# Patient Record
Sex: Male | Born: 1980 | Race: White | Hispanic: No | Marital: Single | Smoking: Former smoker
Health system: Southern US, Community
[De-identification: ages and names within clinical notes are randomized; demographics above are authoritative.]

---

## 2002-12-07 ENCOUNTER — Emergency Department (HOSPITAL_COMMUNITY): Admission: EM | Admit: 2002-12-07 | Discharge: 2002-12-07 | Payer: Self-pay | Admitting: Emergency Medicine

## 2002-12-07 ENCOUNTER — Encounter: Payer: Self-pay | Admitting: Emergency Medicine

## 2004-10-31 ENCOUNTER — Inpatient Hospital Stay (HOSPITAL_COMMUNITY): Admission: AC | Admit: 2004-10-31 | Discharge: 2004-12-05 | Payer: Self-pay

## 2004-12-22 ENCOUNTER — Ambulatory Visit (HOSPITAL_COMMUNITY): Admission: RE | Admit: 2004-12-22 | Discharge: 2004-12-22 | Payer: Self-pay | Admitting: Neurosurgery

## 2005-02-06 ENCOUNTER — Ambulatory Visit (HOSPITAL_COMMUNITY): Admission: RE | Admit: 2005-02-06 | Discharge: 2005-02-06 | Payer: Self-pay | Admitting: Neurosurgery

## 2005-04-03 ENCOUNTER — Ambulatory Visit (HOSPITAL_COMMUNITY): Admission: RE | Admit: 2005-04-03 | Discharge: 2005-04-03 | Payer: Self-pay | Admitting: Neurosurgery

## 2009-06-28 HISTORY — PX: OTHER SURGICAL HISTORY: SHX169

## 2017-07-14 ENCOUNTER — Emergency Department: Payer: Self-pay

## 2017-07-14 ENCOUNTER — Emergency Department
Admission: EM | Admit: 2017-07-14 | Discharge: 2017-07-14 | Disposition: A | Discharge status: Home / Self Care | Payer: Self-pay | Attending: Emergency Medicine | Admitting: Emergency Medicine

## 2017-07-14 DIAGNOSIS — Y929 Unspecified place or not applicable: Secondary | ICD-10-CM | POA: Insufficient documentation

## 2017-07-14 DIAGNOSIS — Y990 Civilian activity done for income or pay: Secondary | ICD-10-CM | POA: Insufficient documentation

## 2017-07-14 DIAGNOSIS — S62335A Displaced fracture of neck of fourth metacarpal bone, left hand, initial encounter for closed fracture: Secondary | ICD-10-CM | POA: Insufficient documentation

## 2017-07-14 DIAGNOSIS — W2209XA Striking against other stationary object, initial encounter: Secondary | ICD-10-CM | POA: Insufficient documentation

## 2017-07-14 DIAGNOSIS — S62337A Displaced fracture of neck of fifth metacarpal bone, left hand, initial encounter for closed fracture: Secondary | ICD-10-CM | POA: Insufficient documentation

## 2017-07-14 DIAGNOSIS — Y939 Activity, unspecified: Secondary | ICD-10-CM | POA: Insufficient documentation

## 2017-07-14 MED ORDER — OXYCODONE HCL 5 MG PO TABS: 5.0000 mg | ORAL_TABLET | Freq: Three times a day (TID) | ORAL | 0 refills | Status: AC | PRN

## 2017-07-14 MED ORDER — OXYCODONE-ACETAMINOPHEN 5-325 MG PO TABS
1.0000 | ORAL_TABLET | Freq: Once | ORAL | Status: AC
Start: 2017-07-14 — End: 2017-07-14
  Administered 2017-07-14: 1 via ORAL
  Filled 2017-07-14: qty 1

## 2017-07-14 MED ORDER — IBUPROFEN 800 MG PO TABS: 800.0000 mg | ORAL_TABLET | Freq: Three times a day (TID) | ORAL | 0 refills | Status: DC | PRN

## 2017-07-14 NOTE — ED Notes (Signed)
Reviewed d/c instructions, prescriptions, follow-up care, splint care, use of ice/elevation with patient. Pt verbalized understanding.

## 2017-07-14 NOTE — Discharge Instructions (Signed)
Please rest ice and elevate the left upper extremity. I would recommend avoiding repetitive lifting and pushing pulling with the left hand to allow the swelling decrease. Call orthopedics in 1 day to schedule follow-up appointment.

## 2017-07-14 NOTE — ED Provider Notes (Signed)
[facility]-EMERGENCY DEPARTMENT Provider Note   CSN: [id number] Arrival date & time: 07/14/17  2154     History   Chief Complaint Chief Complaint  Patient presents with  . Hand Injury    HPI Kevin Gilbert is a 36 y.o. male who presents to the emergency department for evaluation of left hand pain and swelling. Patient states yesterday morning he punched a truck, developed pain and swelling throughout the left hand. He continued to work, developed swelling throughout the hand. His pain is currently moderate. He has tried ibuprofen and Aleve with mild improvement. He denies any numbness or tingling throughout the left hand. He is right-hand dominant. Denies any other injury to his body.  HPI  No past medical history on file.  There are no active problems to display for this patient.   No past surgical history on file.     Home Medications    Prior to Admission medications   Medication Sig Start Date End Date Taking? Authorizing Provider  ibuprofen (ADVIL,MOTRIN) 800 MG tablet Take 1 tablet (800 mg total) by mouth every 8 (eight) hours as needed. 07/14/17   Evon Slack, PA-C  oxyCODONE (ROXICODONE) 5 MG immediate release tablet Take 1 tablet (5 mg total) by mouth every 8 (eight) hours as needed. 07/14/17 07/14/18  Evon Slack, PA-C    Family History No family history on file.  Social History Social History  Substance Use Topics  . Smoking status: Not on file  . Smokeless tobacco: Not on file  . Alcohol use Not on file     Allergies   Patient has no known allergies.   Review of Systems Review of Systems  Constitutional: Negative for fever.  Musculoskeletal: Positive for arthralgias and joint swelling. Negative for neck pain and neck stiffness.  Skin: Negative for rash and wound.  Neurological: Negative for numbness.     Physical Exam Updated Vital Signs BP (!) 138/104   Pulse 100   Temp 98.3 F (36.8 C) (Oral)   Resp 16   Ht 5\' 11"  (1.803 m)    Wt 95.3 kg (210 lb)   SpO2 96%   BMI 29.29 kg/m   Physical Exam  Constitutional: He appears well-developed and well-nourished.  HENT:  Head: Normocephalic and atraumatic.  Eyes: Conjunctivae are normal.  Neck: Normal range of motion.  Cardiovascular: Normal rate.   Pulmonary/Chest: Effort normal. No respiratory distress.  Musculoskeletal:  Examination of the left hand just patient has moderate swelling throughout the carpals, third fourth and fifth digits of the right hand. He has good range of motion of the thumb and index finger. There is no warmth or redness. No open wounds. He is tender along the fourth and fifth MCP joints.  Lymphadenopathy:    He has no cervical adenopathy.  Neurological: He is alert.  Skin: Skin is warm. No rash noted. No erythema.     ED Treatments / Results  Labs (all labs ordered are listed, but only abnormal results are displayed) Labs Reviewed - No data to display  EKG  EKG Interpretation None       Radiology Dg Hand Complete Left  Result Date: 07/14/2017 CLINICAL DATA:  Dorsal pain and swelling after punching a truck EXAM: LEFT HAND - COMPLETE 3+ VIEW COMPARISON:  None. FINDINGS: Examination is limited by positioning and flexion of the digits. Diffuse soft tissue swelling over the dorsum of the hand. No subluxation. Acute nondisplaced fractures involving the necks of the fourth and fifth metacarpals with  mild volar angulation of the distal fracture fragments. No radiopaque foreign body. Possible old deformity of the second proximal phalanx IMPRESSION: Acute slightly angulated fractures involving the distal fourth and fifth metacarpals. Electronically Signed   By: Jasmine Pang M.D.   On: 07/14/2017 22:22    Procedures Procedures (including critical care time) SPLINT APPLICATION Date/Time: 11:22 PM Authorized by: Patience Musca Consent: Verbal consent obtained. Risks and benefits: risks, benefits and alternatives were  discussed Consent given by: patient Splint applied by: ED physician assistant Location details: left hand Splint type: on a gutter Supplies used: cast padding, Ortho-Glass, Ace wrap Post-procedure: The splinted body part was neurovascularly unchanged following the procedure. Patient tolerance: Patient tolerated the procedure well with no immediate complications.     Medications Ordered in ED Medications  oxyCODONE-acetaminophen (PERCOCET/ROXICET) 5-325 MG per tablet 1 tablet (1 tablet Oral Given 07/14/17 2238)     Initial Impression / Assessment and Plan / ED Course  I have reviewed the triage vital signs and the nursing notes.  Pertinent labs & imaging results that were available during my care of the patient were reviewed by me and considered in my medical decision making (see chart for details).     36 year old male with fourth and fifth metacarpal neck fractures, minimal displacement. Patient is placed into it on a gutter splint. He will take ibuprofen as needed for pain. Prescription for oxycodone is given for severe pain as needed. He will call orthopedics to schedule follow-up appointment. Return to the ER for any increasing pain, swelling, numbness/ tingling or for any worsening symptoms or changes in his health.  Final Clinical Impressions(s) / ED Diagnoses   Final diagnoses:  Closed displaced fracture of neck of fourth metacarpal bone of left hand, initial encounter  Closed displaced fracture of neck of fifth metacarpal bone of left hand, initial encounter    New Prescriptions New Prescriptions   IBUPROFEN (ADVIL,MOTRIN) 800 MG TABLET    Take 1 tablet (800 mg total) by mouth every 8 (eight) hours as needed.   OXYCODONE (ROXICODONE) 5 MG IMMEDIATE RELEASE TABLET    Take 1 tablet (5 mg total) by mouth every 8 (eight) hours as needed.     Evon Slack, PA-C 07/14/17 2324    Phineas Semen, MD 07/14/17 [phone]

## 2017-07-14 NOTE — ED Triage Notes (Signed)
Patient states that he punched a truck with his left hand. Patient with pain and swelling to left hand.

## 2017-07-14 NOTE — ED Triage Notes (Signed)
Pt states he punched a truck last pm resulting in left hand injury. Sensory and cap refill intact to finger, however pt is not able to move fingers.

## 2021-07-17 ENCOUNTER — Emergency Department: Payer: 59

## 2021-07-17 ENCOUNTER — Inpatient Hospital Stay: Payer: 59

## 2021-07-17 ENCOUNTER — Encounter: Payer: Self-pay | Admitting: Family Medicine

## 2021-07-17 ENCOUNTER — Inpatient Hospital Stay
Admission: EM | Admit: 2021-07-17 | Discharge: 2021-07-18 | DRG: 101 | Disposition: A | Discharge status: Home / Self Care | Payer: 59 | Attending: Internal Medicine | Admitting: Internal Medicine

## 2021-07-17 ENCOUNTER — Other Ambulatory Visit: Payer: Self-pay

## 2021-07-17 DIAGNOSIS — F10239 Alcohol dependence with withdrawal, unspecified: Secondary | ICD-10-CM | POA: Diagnosis present

## 2021-07-17 DIAGNOSIS — F10129 Alcohol abuse with intoxication, unspecified: Secondary | ICD-10-CM

## 2021-07-17 DIAGNOSIS — F10230 Alcohol dependence with withdrawal, uncomplicated: Secondary | ICD-10-CM | POA: Diagnosis not present

## 2021-07-17 DIAGNOSIS — Y900 Blood alcohol level of less than 20 mg/100 ml: Secondary | ICD-10-CM | POA: Diagnosis present

## 2021-07-17 DIAGNOSIS — Z87891 Personal history of nicotine dependence: Secondary | ICD-10-CM | POA: Diagnosis not present

## 2021-07-17 DIAGNOSIS — R569 Unspecified convulsions: Secondary | ICD-10-CM | POA: Diagnosis present

## 2021-07-17 DIAGNOSIS — Z20822 Contact with and (suspected) exposure to covid-19: Secondary | ICD-10-CM | POA: Diagnosis present

## 2021-07-17 DIAGNOSIS — F10939 Alcohol use, unspecified with withdrawal, unspecified: Secondary | ICD-10-CM

## 2021-07-17 DIAGNOSIS — R030 Elevated blood-pressure reading, without diagnosis of hypertension: Secondary | ICD-10-CM | POA: Diagnosis present

## 2021-07-17 DIAGNOSIS — Z8249 Family history of ischemic heart disease and other diseases of the circulatory system: Secondary | ICD-10-CM

## 2021-07-17 DIAGNOSIS — Z820 Family history of epilepsy and other diseases of the nervous system: Secondary | ICD-10-CM

## 2021-07-17 DIAGNOSIS — Z833 Family history of diabetes mellitus: Secondary | ICD-10-CM

## 2021-07-17 LAB — BASIC METABOLIC PANEL WITH GFR
Anion gap: 10 (ref 5–15)
BUN: 16 mg/dL (ref 6–20)
CO2: 23 mmol/L (ref 22–32)
Calcium: 8.6 mg/dL — ABNORMAL LOW (ref 8.9–10.3)
Chloride: 102 mmol/L (ref 98–111)
Creatinine, Ser: 0.77 mg/dL (ref 0.61–1.24)
GFR, Estimated: >60 mL/min
Glucose, Bld: 80 mg/dL (ref 70–99)
Potassium: 3.8 mmol/L (ref 3.5–5.1)
Sodium: 135 mmol/L (ref 135–145)

## 2021-07-17 LAB — COMPREHENSIVE METABOLIC PANEL
ALT: 33 U/L (ref 0–44)
AST: 53 U/L — ABNORMAL HIGH (ref 15–41)
Albumin: 4.8 g/dL (ref 3.5–5.0)
Alkaline Phosphatase: 55 U/L (ref 38–126)
Anion gap: 24 — ABNORMAL HIGH (ref 5–15)
BUN: 17 mg/dL (ref 6–20)
CO2: 15 mmol/L — ABNORMAL LOW (ref 22–32)
Calcium: 9.4 mg/dL (ref 8.9–10.3)
Chloride: 96 mmol/L — ABNORMAL LOW (ref 98–111)
Creatinine, Ser: 0.88 mg/dL (ref 0.61–1.24)
GFR, Estimated: >60 mL/min (ref >60)
Glucose, Bld: 102 mg/dL — ABNORMAL HIGH (ref 70–99)
Potassium: 3.6 mmol/L (ref 3.5–5.1)
Sodium: 135 mmol/L (ref 135–145)
Total Bilirubin: 1.3 mg/dL — ABNORMAL HIGH (ref 0.3–1.2)
Total Protein: 7.9 g/dL (ref 6.5–8.1)

## 2021-07-17 LAB — CBC
HCT: 41.5 % (ref 39.0–52.0)
HCT: 46.8 % (ref 39.0–52.0)
Hemoglobin: 14.8 g/dL (ref 13.0–17.0)
Hemoglobin: 16.1 g/dL (ref 13.0–17.0)
MCH: 32.4 pg (ref 26.0–34.0)
MCH: 33.6 pg (ref 26.0–34.0)
MCHC: 34.4 g/dL (ref 30.0–36.0)
MCHC: 35.7 g/dL (ref 30.0–36.0)
MCV: 94.2 fL (ref 80.0–100.0)
MCV: 94.3 fL (ref 80.0–100.0)
Platelets: 214 K/uL (ref 150–400)
Platelets: 269 10*3/uL (ref 150–400)
RBC: 4.4 MIL/uL (ref 4.22–5.81)
RBC: 4.97 MIL/uL (ref 4.22–5.81)
RDW: 12.8 % (ref 11.5–15.5)
RDW: 13.1 % (ref 11.5–15.5)
WBC: 6.7 10*3/uL (ref 4.0–10.5)
WBC: 8.4 K/uL (ref 4.0–10.5)
nRBC: 0 % (ref 0.0–0.2)
nRBC: 0 % (ref 0.0–0.2)

## 2021-07-17 LAB — URINE DRUG SCREEN, QUALITATIVE (ARMC ONLY)
Amphetamines, Ur Screen: NOT DETECTED
Barbiturates, Ur Screen: NOT DETECTED
Benzodiazepine, Ur Scrn: NOT DETECTED
Cannabinoid 50 Ng, Ur ~~LOC~~: NOT DETECTED
Cocaine Metabolite,Ur ~~LOC~~: NOT DETECTED
MDMA (Ecstasy)Ur Screen: NOT DETECTED
Methadone Scn, Ur: NOT DETECTED
Opiate, Ur Screen: NOT DETECTED
Phencyclidine (PCP) Ur S: NOT DETECTED
Tricyclic, Ur Screen: NOT DETECTED

## 2021-07-17 LAB — RESP PANEL BY RT-PCR (FLU A&B, COVID) ARPGX2
Influenza A by PCR: NEGATIVE
Influenza B by PCR: NEGATIVE
SARS Coronavirus 2 by RT PCR: NEGATIVE

## 2021-07-17 LAB — LIPASE, BLOOD: Lipase: 30 U/L (ref 11–51)

## 2021-07-17 LAB — ETHANOL: Alcohol, Ethyl (B): <10 mg/dL (ref <10)

## 2021-07-17 LAB — MAGNESIUM: Magnesium: 2.3 mg/dL (ref 1.7–2.4)

## 2021-07-17 LAB — HIV ANTIBODY (ROUTINE TESTING W REFLEX): HIV Screen 4th Generation wRfx: NONREACTIVE

## 2021-07-17 MED ORDER — MAGNESIUM HYDROXIDE 400 MG/5ML PO SUSP
30.0000 mL | Freq: Every day | ORAL | Status: DC | PRN
  Administered 2021-07-17: 30 mL via ORAL
  Filled 2021-07-17: qty 30

## 2021-07-17 MED ORDER — THIAMINE HCL 100 MG PO TABS
100.0000 mg | ORAL_TABLET | Freq: Every day | ORAL | Status: DC
  Administered 2021-07-17 – 2021-07-18 (×2): 100 mg via ORAL
  Filled 2021-07-17 (×2): qty 1

## 2021-07-17 MED ORDER — LORAZEPAM 2 MG/ML IJ SOLN
2.0000 mg | Freq: Once | INTRAMUSCULAR | Status: AC
  Administered 2021-07-17: 2 mg via INTRAVENOUS
  Filled 2021-07-17: qty 1

## 2021-07-17 MED ORDER — ACETAMINOPHEN 325 MG PO TABS
650.0000 mg | ORAL_TABLET | Freq: Four times a day (QID) | ORAL | Status: DC | PRN
  Administered 2021-07-17 – 2021-07-18 (×3): 650 mg via ORAL
  Filled 2021-07-17 (×3): qty 2

## 2021-07-17 MED ORDER — LORAZEPAM 2 MG/ML IJ SOLN
0.0000 mg | Freq: Four times a day (QID) | INTRAMUSCULAR | Status: DC
Start: 2021-07-17 — End: 2021-07-18
  Administered 2021-07-17: 1 mg via INTRAVENOUS
  Administered 2021-07-17: 2 mg via INTRAVENOUS
  Administered 2021-07-17: 1 mg via INTRAVENOUS
  Administered 2021-07-18: 2 mg via INTRAVENOUS
  Filled 2021-07-17 (×4): qty 1

## 2021-07-17 MED ORDER — POTASSIUM CHLORIDE IN NACL 20-0.9 MEQ/L-% IV SOLN
INTRAVENOUS | Status: DC
  Filled 2021-07-17 (×5): qty 1000

## 2021-07-17 MED ORDER — ONDANSETRON HCL 4 MG/2ML IJ SOLN: 4.0000 mg | Freq: Four times a day (QID) | INTRAMUSCULAR | Status: DC | PRN

## 2021-07-17 MED ORDER — TRAZODONE HCL 50 MG PO TABS: 25.0000 mg | ORAL_TABLET | Freq: Every evening | ORAL | Status: DC | PRN

## 2021-07-17 MED ORDER — ONDANSETRON HCL 4 MG PO TABS: 4.0000 mg | ORAL_TABLET | Freq: Four times a day (QID) | ORAL | Status: DC | PRN

## 2021-07-17 MED ORDER — ACETAMINOPHEN 650 MG RE SUPP: 650.0000 mg | Freq: Four times a day (QID) | RECTAL | Status: DC | PRN

## 2021-07-17 MED ORDER — THIAMINE HCL 100 MG/ML IJ SOLN: Freq: Once | INTRAVENOUS | Status: DC

## 2021-07-17 MED ORDER — THIAMINE HCL 100 MG/ML IJ SOLN
Freq: Once | INTRAVENOUS | Status: AC
  Filled 2021-07-17: qty 1000

## 2021-07-17 MED ORDER — SODIUM CHLORIDE 0.9 % IV BOLUS
1000.0000 mL | Freq: Once | INTRAVENOUS | Status: AC
  Administered 2021-07-17: 1000 mL via INTRAVENOUS

## 2021-07-17 MED ORDER — ENOXAPARIN SODIUM 40 MG/0.4ML IJ SOSY
40.0000 mg | PREFILLED_SYRINGE | INTRAMUSCULAR | Status: DC
  Administered 2021-07-17 – 2021-07-18 (×2): 40 mg via SUBCUTANEOUS
  Filled 2021-07-17 (×2): qty 0.4

## 2021-07-17 MED ORDER — GADOBUTROL 1 MMOL/ML IV SOLN
10.0000 mL | Freq: Once | INTRAVENOUS | Status: AC | PRN
  Administered 2021-07-17: 10 mL via INTRAVENOUS

## 2021-07-17 NOTE — ED Notes (Signed)
bedrails padded for seizure precautions, pt denies any other needs at this time

## 2021-07-17 NOTE — H&P (Signed)
[place]   PATIENT NAME: Kevin Gilbert    MR#:  [id number]  DATE OF BIRTH:  November 04, 1981  DATE OF ADMISSION:  07/17/2021  PRIMARY CARE PHYSICIAN: Patient, No Pcp Per (Inactive)   Patient is coming from: Home  REQUESTING/REFERRING PHYSICIAN: Chiquita Loth, MD  CHIEF COMPLAINT:   Chief Complaint  Patient presents with  . Altered Mental Status    HISTORY OF PRESENT ILLNESS:  Kevin Gilbert is a 40 y.o. Caucasian male with medical history significant for alcohol abuse, who presented to the emergency room with acute onset of tonic-clonic seizures.  The patient was found by his father and his room with bleeding from his mouth.  He admitted to biting his tongue.  He was having postictal confusion that is currently better.  Denies any paresthesias or focal muscle weakness.  No chest pain or dyspnea or palpitations.  No cough or wheezing.  No dysuria, oliguria or hematuria or flank pain.  He did not have any head injuries.  He continues to drink about a sixpack of beer daily and his last alcoholic drink was yesterday morning. ED Course: When he came to the ER blood pressure was 150/94 with heart rate of 131 and later 110 temperature of 99 EKG as reviewed by me : Showed sinus tachycardia with rate of 118 and T wave inversion inferiorly Imaging: Chest x-ray showed no acute cardiopulmonary disease.  The patient was given 20 g of IV Ativan and was placed on CIWA protocol.  He was given 1 L bolus of IV normal saline as well as a banana bag.  He will be admitted to a progressive unit bed for further evaluation and management. PAST MEDICAL HISTORY:  Alcohol abuse PAST SURGICAL HISTORY:  He denies any previous surgeries He denies any previous surgeries. SOCIAL HISTORY:   Social History   Tobacco Use  . Smoking status: Not on file  . Smokeless tobacco: Not on file  Substance Use Topics  . Alcohol use: Not on file  He drinks about sixpack of beer per day.  No history of tobacco  abuse or illicit drug use.  FAMILY HISTORY:  Positive for diabetes mellitus in his father as well as hypertension.  History is positive for seizures in his mother.  DRUG ALLERGIES:  No Known Allergies  REVIEW OF SYSTEMS:   ROS As per history of present illness. All pertinent systems were reviewed above. Constitutional, HEENT, cardiovascular, respiratory, GI, GU, musculoskeletal, neuro, psychiatric, endocrine, integumentary and hematologic systems were reviewed and are otherwise negative/unremarkable except for positive findings mentioned above in the HPI.   MEDICATIONS AT HOME:   Prior to Admission medications   Not on File      VITAL SIGNS:  Blood pressure (!) 148/91, pulse (!) 107, temperature 99 F (37.2 C), resp. rate 20, height 5\' 11"  (1.803 m), weight 81.6 kg, SpO2 93 %.  PHYSICAL EXAMINATION:  Physical Exam  GENERAL:  40 y.o.-year-old Caucasian male patient lying in the bed with no acute distress.  EYES: Pupils equal, round, reactive to light and accommodation. No scleral icterus. Extraocular muscles intact.  HEENT: Head atraumatic, normocephalic. Oropharynx with positive tongue laceration and nasopharynx clear.  NECK:  Supple, no jugular venous distention. No thyroid enlargement, no tenderness.  LUNGS: Normal breath sounds bilaterally, no wheezing, rales,rhonchi or crepitation. No use of accessory muscles of respiration.  CARDIOVASCULAR: Regular rate and rhythm, S1, S2 normal. No murmurs, rubs, or gallops.  ABDOMEN: Soft, nondistended, nontender. Bowel sounds present. No  organomegaly or mass.  EXTREMITIES: No pedal edema, cyanosis, or clubbing.  NEUROLOGIC: Cranial nerves II through XII are intact. Muscle strength 5/5 in all extremities. Sensation intact. Gait not checked.  PSYCHIATRIC: The patient is alert and oriented x 3.  Normal affect and good eye contact. SKIN: No obvious rash, lesion, or ulcer.   LABORATORY PANEL:   CBC Recent Labs  Lab 07/17/21 0040  WBC  6.7  HGB 16.1  HCT 46.8  PLT 269   ------------------------------------------------------------------------------------------------------------------  Chemistries  Recent Labs  Lab 07/17/21 0040  NA 135  K 3.6  CL 96*  CO2 15*  GLUCOSE 102*  BUN 17  CREATININE 0.88  CALCIUM 9.4  AST 53*  ALT 33  ALKPHOS 55  BILITOT 1.3*   ------------------------------------------------------------------------------------------------------------------  Cardiac Enzymes No results for input(s): TROPONINI in the last 168 hours. ------------------------------------------------------------------------------------------------------------------  RADIOLOGY:  CT Head Wo Contrast  Result Date: 07/17/2021 CLINICAL DATA:  Altered mental status. EXAM: CT HEAD WITHOUT CONTRAST TECHNIQUE: Contiguous axial images were obtained from the base of the skull through the vertex without intravenous contrast. COMPARISON:  None. FINDINGS: Brain: The ventricles and sulci appropriate size for patient's age. The gray-white matter discrimination is preserved. There is no acute intracranial hemorrhage. No mass effect or midline shift. No extra-axial fluid collection. Vascular: No hyperdense vessel or unexpected calcification. Skull: Normal. Negative for fracture or focal lesion. Sinuses/Orbits: The visualized paranasal sinuses and mastoid air cells are clear. Chronic changes of the left globe. Other: None IMPRESSION: Unremarkable noncontrast CT of the brain. Electronically Signed   By: Elgie Collard M.D.   On: 07/17/2021 02:20   DG Chest Port 1 View  Result Date: 07/17/2021 CLINICAL DATA:  Snoring and sweating with blood coming from his mouth. EXAM: PORTABLE CHEST 1 VIEW COMPARISON:  December 04, 2004 FINDINGS: The heart size and mediastinal contours are within normal limits. Decreased lung volumes are seen. Both lungs are clear. No acute osseous abnormalities are identified. IMPRESSION: No active cardiopulmonary disease.  Electronically Signed   By: Aram Candela M.D.   On: 07/17/2021 01:15      IMPRESSION AND PLAN:  Active Problems:   Alcohol withdrawal seizure (HCC)  1.  New onset seizure likely alcohol withdrawal seizure. - The patient will be admitted to a progressive unit bed. - Seizure precautions will be provided. - We will obtain an EEG. - The patient will be placed on as needed IV Ativan. - Neurology consult will be obtained. - I notified Dr. Wilford Corner about the patient.  2.  Ongoing alcohol abuse. - I counseled him for cessation.  3.  Elevated blood pressure reading. - We will follow his blood pressure for now.  DVT prophylaxis: Lovenox. Code Status: full code. Family Communication:  The plan of care was discussed in details with the patient (and family). I answered all questions. The patient agreed to proceed with the above mentioned plan. Further management will depend upon hospital course. Disposition Plan: Back to previous home environment Consults called: Neurology. All the records are reviewed and case discussed with ED provider.  Status is: Inpatient  Remains inpatient appropriate because:Altered mental status, Ongoing diagnostic testing needed not appropriate for outpatient work up, Unsafe d/c plan, IV treatments appropriate due to intensity of illness or inability to take PO, and Inpatient level of care appropriate due to severity of illness  Dispo: The patient is from: Home              Anticipated d/c is to: Home  Patient currently is not medically stable to d/c.   Difficult to place patient No   TOTAL TIME TAKING CARE OF THIS PATIENT: 55 minutes.    Hannah Beat M.D on 07/17/2021 at 3:23 AM  [facility]   From 7 PM-7 AM, contact night-coverage [email or web address]  CC: Primary care physician; Patient, No Pcp Per (Inactive)

## 2021-07-17 NOTE — Procedures (Signed)
History: 40 yo M being evaluated for seizure  Sedation: Noen  Technique: This EEG was acquired with electrodes placed according to the International 10-20 electrode system (including Fp1, Fp2, F3, F4, C3, C4, P3, P4, O1, O2, T3, T4, T5, T6, A1, A2, Fz, Cz, Pz). The following electrodes were missing or displaced: none.   Background: The background consists of intermixed alpha and beta activities. There is a well defined posterior dominant rhythm of 9 Hz that attenuates with eye opening. With drowsiness, he has some frontally predominant intermittent rhythmic delta activity(FIRDA). Sleep is recorded with normal appearing structures.   Photic stimulation: Physiologic driving is present  EEG Abnormalities: 1) FIRDA  Clinical Interpretation: This normal EEG is recorded in the waking and sleep state. Though FIRDA can be associated with encephalopahty, it can also be seen in normal drowsiness.   There was no seizure or seizure predisposition recorded on this study. Please note that lack of epileptiform activity on EEG does not preclude the possibility of epilepsy.   Ritta Slot, MD [facility] [phone]  If 7pm- 7am, please page neurology on call as listed in AMION.

## 2021-07-17 NOTE — ED Triage Notes (Addendum)
Per father pt was snoring and sweating with blood coming from his mouth and on his beard this pm. Pt is altered. Per father pt with history of alcohol abuse. Pt states drinks daily and has not had any alcohol since this am. Pt is diaphoretic. Pt alert to self and place.

## 2021-07-17 NOTE — ED Notes (Addendum)
coka cola provided, no other needs at this time. Pt reports unable to urinate at this time

## 2021-07-17 NOTE — Progress Notes (Signed)
No charge progress note.  Kevin Gilbert is a 40 y.o. Caucasian male with medical history significant for alcohol abuse, who presented to the emergency room with acute onset of tonic-clonic seizures.  The patient was found by his father his room with tonic-clonic seizure and bleeding from his mouth.  He admitted to biting his tongue.  No prior history of seizures.  Had family history of seizures in mother and aunts.  Patient drinks daily with no recent change in the drinking habit.  Denies any withdrawals before.  He did drink on the day of admission although alcohol level were negative.  UDS negative.  CT head and MRI brain was without any acute abnormality.  Magnesium within normal limit.  No significant lab abnormalities.  Neurology was also consulted and they will consider as unprovoked seizure. EEG pending.  If EEG is negative then he might not antiseizure medication at this time with a close observation and no driving for 49-month.  Continue to monitor. Continue with CIWA protocol  Mother at bedside.

## 2021-07-17 NOTE — Consult Note (Addendum)
Neurology Consultation Reason for Consult: Seizure Referring Physician: Andrez Grime  CC: Seizure  History is obtained from: Patient, mother  HPI: Kevin Gilbert is a 40 y.o. male with no significant past medical history who presents with new onset seizure.  He was in his normal state of health and then his father found him to have had a seizure.  He had a significant tongue bite.  He had no antecedent symptoms, no prodrome.  He did have a postictal state.  He does drink on a daily basis, stating that he drinks about a sixpack of [employer] light a day.  He had not significantly changed his drinking and did indeed drink the day of his seizure.  He denies any history of muscle jerks.  He denies any staring spells, episodes of loss time.  He denies any previous episodes of seizure.  ROS: A 14 point ROS was performed and is negative except as noted in the HPI.   Past medical history: No significant history  Family history: Mother and aunt with seizures.  Social History:  reports that he has quit smoking. His smoking use included cigarettes. He has never used smokeless tobacco. He reports current alcohol use of about 42.0 standard drinks per week. He reports that he does not currently use drugs.   Exam: Current vital signs: BP (!) 146/94 (BP Location: Left Arm)   Pulse 96   Temp 98.3 F (36.8 C) (Oral)   Resp 18   Ht 5\' 11"  (1.803 m)   Wt 106.4 kg   SpO2 97%   BMI 32.72 kg/m  Vital signs in last 24 hours: Temp:  [98.3 F (36.8 C)-99 F (37.2 C)] 98.3 F (36.8 C) (08/29 0956) Pulse Rate:  [95-131] 96 (08/29 0956) Resp:  [15-21] 18 (08/29 0956) BP: (123-150)/(73-94) 146/94 (08/29 0956) SpO2:  [91 %-98 %] 97 % (08/29 0956) Weight:  [81.6 kg-106.4 kg] 106.4 kg (08/29 0956)   Physical Exam  Constitutional: Appears well-developed and well-nourished.  Psych: Affect appropriate to situation Eyes: No scleral injection HENT: No OP obstruction, lateral tongue bite is present MSK: no  joint deformities.  Cardiovascular: Normal rate and regular rhythm.  Respiratory: Effort normal, non-labored breathing GI: Soft.  No distension. There is no tenderness.  Skin: WDI  Neuro: Mental Status: Patient is awake, alert, oriented to person, place, month, year, and situation. Patient is able to give a clear and coherent history. No signs of aphasia or neglect Cranial Nerves: II: Visual Fields are full. R pupil reactive, left pupil non-reactive(baseline) III,IV, VI: EOMI without ptosis or diploplia.  V: Facial sensation is symmetric to temperature VII: Facial movement is symmetric.  VIII: hearing is intact to voice X: Uvula elevates symmetrically XI: Shoulder shrug is symmetric. XII: tongue is midline without atrophy or fasciculations.  Motor: Tone is normal. Bulk is normal. 5/5 strength was present in all four extremities.  Sensory: Sensation is symmetric to light touch and temperature in the arms and legs. Cerebellar: FNF and HKS are intact bilaterally    I have reviewed labs in epic and the results pertinent to this consultation are: UDS is negative Sodium 135 Calcium 8.6   I have reviewed the images obtained: MRI brain-normal  Impression: 40 year old male with new onset seizure.  Given that he had not modified his drinking, I am not certain that this represents an alcohol withdrawal seizure and I would in fact consider this an unprovoked seizure.  Given that it is his first, without EEG abnormality I would  not necessarily start antiepileptic therapy.  He does, however have a strong family history and an EEG is indicated.  Recommendations: 1) EEG 2) he will not be able to drive for 6 months. 3) magnesium 4) neurology will follow   Ritta Slot, MD [facility] [phone]  If 7pm- 7am, please page neurology on call as listed in AMION.

## 2021-07-17 NOTE — ED Provider Notes (Signed)
[facility] Emergency Department Provider Note   ____________________________________________   Event Date/Time   First MD Initiated Contact with Patient 07/17/21 [phone]     (approximate)  I have reviewed the triage vital signs and the nursing notes.   HISTORY  Chief Complaint Altered Mental Status  Level V caveat: Limited by postictal state History obtained by patient's father  HPI Kevin Gilbert is a 40 y.o. male brought to the ED from home by his father with a chief complaint of altered mentation.  Father reports patient with history of alcohol abuse.  Patient states he drinks a sixpack of beer daily.  No alcohol since yesterday morning.  Father heard strange noises coming from patient's bed and found patient snoring with blood coming from his mouth and beard; found him difficult to arouse.  Patient alert to place and self on arrival to the ED.  Denies headache, vision changes, neck pain, fever, cough, chest pain, shortness of breath, abdominal pain, nausea, vomiting or urinary incontinence.  Did bite his tongue.     Past medical history None  Patient Active Problem List   Diagnosis Date Noted   Alcohol withdrawal seizure (HCC) 07/17/2021      Prior to Admission medications   Not on File    Allergies Patient has no known allergies.  No family history on file.  Social History    Review of Systems  Constitutional: No fever/chills Eyes: No visual changes. ENT: No sore throat. Cardiovascular: Denies chest pain. Respiratory: Denies shortness of breath. Gastrointestinal: No abdominal pain.  No nausea, no vomiting.  No diarrhea.  No constipation. Genitourinary: Negative for dysuria. Musculoskeletal: Negative for back pain. Skin: Negative for rash. Neurological: Positive for postictal state.  Negative for headaches, focal weakness or numbness.   ____________________________________________   PHYSICAL EXAM:  VITAL SIGNS: ED  Triage Vitals [07/17/21 0032]  Enc Vitals Group     BP (!) 150/94     Pulse Rate (!) 131     Resp 16     Temp 99 F (37.2 C)     Temp src      SpO2 93 %     Weight 180 lb (81.6 kg)     Height 5\' 11"  (1.803 m)     Head Circumference      Peak Flow      Pain Score 0     Pain Loc      Pain Edu?      Excl. in GC?     Constitutional: Alert and oriented.  Dazed appearing and in mild acute distress. Eyes: Conjunctivae are normal. PERRL. EOMI. Head: Atraumatic. Nose: No congestion/rhinnorhea. Mouth/Throat: Mucous membranes are moist.  Bit tongue.  No active bleeding. . Neck: No stridor.  No cervical spine tenderness to palpation. Cardiovascular: Tachycardic rate, regular rhythm. Grossly normal heart sounds.  Good peripheral circulation. Respiratory: Normal respiratory effort.  No retractions. Lungs CTAB. Gastrointestinal: Soft and nontender to light or deep palpation. No distention. No abdominal bruits. No CVA tenderness. Musculoskeletal: No lower extremity tenderness nor edema.  No joint effusions. Neurologic: Alert and oriented to person and place.  Mildly delayed speech and language. No gross focal neurologic deficits are appreciated. MAEx4. Skin:  Skin is warm, dry and intact. No rash noted. Psychiatric: Mood and affect are normal. Speech and behavior are normal.  ____________________________________________   LABS (all labs ordered are listed, but only abnormal results are displayed)  Labs Reviewed  COMPREHENSIVE METABOLIC PANEL - Abnormal; Notable for  the following components:      Result Value   Chloride 96 (*)    CO2 15 (*)    Glucose, Bld 102 (*)    AST 53 (*)    Total Bilirubin 1.3 (*)    Anion gap 24 (*)    All other components within normal limits  RESP PANEL BY RT-PCR (FLU A&B, COVID) ARPGX2  CBC  LIPASE, BLOOD  ETHANOL  URINE DRUG SCREEN, QUALITATIVE ([facility] ONLY)  HIV ANTIBODY (ROUTINE TESTING W REFLEX)  BASIC METABOLIC PANEL  CBC    ____________________________________________  EKG  ED ECG REPORT I, Myishia Kasik J, the attending physician, personally viewed and interpreted this ECG.   Date: 07/17/2021  EKG Time: 0040  Rate: 118  Rhythm: sinus tachycardia  Axis: Normal  Intervals:none  ST&T Change: Nonspecific  ____________________________________________  RADIOLOGY I, Hassell Patras J, personally viewed and evaluated these images (plain radiographs) as part of my medical decision making, as well as reviewing the written report by the radiologist.  ED MD interpretation: No ICH, no acute cardiopulmonary process  Official radiology report(s): CT Head Wo Contrast  Result Date: 07/17/2021 CLINICAL DATA:  Altered mental status. EXAM: CT HEAD WITHOUT CONTRAST TECHNIQUE: Contiguous axial images were obtained from the base of the skull through the vertex without intravenous contrast. COMPARISON:  None. FINDINGS: Brain: The ventricles and sulci appropriate size for patient's age. The gray-white matter discrimination is preserved. There is no acute intracranial hemorrhage. No mass effect or midline shift. No extra-axial fluid collection. Vascular: No hyperdense vessel or unexpected calcification. Skull: Normal. Negative for fracture or focal lesion. Sinuses/Orbits: The visualized paranasal sinuses and mastoid air cells are clear. Chronic changes of the left globe. Other: None IMPRESSION: Unremarkable noncontrast CT of the brain. Electronically Signed   By: Elgie Collard M.D.   On: 07/17/2021 02:20   DG Chest Port 1 View  Result Date: 07/17/2021 CLINICAL DATA:  Snoring and sweating with blood coming from his mouth. EXAM: PORTABLE CHEST 1 VIEW COMPARISON:  December 04, 2004 FINDINGS: The heart size and mediastinal contours are within normal limits. Decreased lung volumes are seen. Both lungs are clear. No acute osseous abnormalities are identified. IMPRESSION: No active cardiopulmonary disease. Electronically Signed   By: Aram Candela M.D.   On: 07/17/2021 01:15    ____________________________________________   PROCEDURES  Procedure(s) performed (including Critical Care):  .1-3 Lead EKG Interpretation  Date/Time: 07/17/2021 12:55 AM Performed by: Irean Hong, MD Authorized by: Irean Hong, MD     Interpretation: abnormal     ECG rate:  118   ECG rate assessment: tachycardic     Rhythm: sinus tachycardia     Ectopy: none     Conduction: normal   Comments:     Patient placed on cardiac monitor to evaluate for arrhythmias   CRITICAL CARE Performed by: Irean Hong   Total critical care time: 30 minutes  Critical care time was exclusive of separately billable procedures and treating other patients.  Critical care was necessary to treat or prevent imminent or life-threatening deterioration.  Critical care was time spent personally by me on the following activities: development of treatment plan with patient and/or surrogate as well as nursing, discussions with consultants, evaluation of patient's response to treatment, examination of patient, obtaining history from patient or surrogate, ordering and performing treatments and interventions, ordering and review of laboratory studies, ordering and review of radiographic studies, pulse oximetry and re-evaluation of patient's condition.  ____________________________________________   INITIAL IMPRESSION /  ASSESSMENT AND PLAN / ED COURSE  As part of my medical decision making, I reviewed the following data within the electronic MEDICAL RECORD NUMBER History obtained from family, Nursing notes reviewed and incorporated, Labs reviewed, EKG interpreted, Old chart reviewed, Radiograph reviewed, and Notes from prior ED visits     40 year old male presenting with altered mentation, postictal state; history of alcohol abuse. Differential diagnosis includes, but is not limited to, alcohol, illicit or prescription medications, or other toxic ingestion; intracranial  pathology such as stroke or intracerebral hemorrhage; fever or infectious causes including sepsis; hypoxemia and/or hypercarbia; uremia; trauma; endocrine related disorders such as diabetes, hypoglycemia, and thyroid-related diseases; hypertensive encephalopathy; etc.   Likely alcohol withdrawal seizure given patient does not have prior history of seizure disorder.  Will obtain lab work, CT head, placed on CIWA protocol.  2 mg IV Ativan given.  Will reassess.  Clinical Course as of 07/17/21 0529  Mon Jul 17, 2021  0249 Updated patient and his father on all test results.  Tachycardia improving.  Patient is on CIWA scale.  Will discuss with hospitalist services for admission. [JS]    Clinical Course User Index [JS] Irean Hong, MD     ____________________________________________   FINAL CLINICAL IMPRESSION(S) / ED DIAGNOSES  Final diagnoses:  Alcohol withdrawal seizure without complication [facility])     ED Discharge Orders     None        Note:  This document was prepared using Dragon voice recognition software and may include unintentional dictation errors.    Irean Hong, MD 07/17/21 0530

## 2021-07-17 NOTE — Progress Notes (Signed)
Eeg done 

## 2021-07-18 ENCOUNTER — Encounter: Payer: Self-pay | Admitting: Family Medicine

## 2021-07-18 MED ORDER — THIAMINE HCL 100 MG PO TABS: 100.0000 mg | ORAL_TABLET | Freq: Every day | ORAL | 0 refills | Status: AC

## 2021-07-18 NOTE — Progress Notes (Signed)
He has had no further seizures.  He is awake, alert, interactive and appropriate and oriented.  His parents report that he seems back to his normal self.  His EEG and MRI both are negative, without clear evidence of seizure predisposition.  This would constitute a new onset seizure, I do not think alcohol withdrawal is at all likely given that he had not changed his daily consumption of six [employer] lights.  If he were to have further unprovoked seizures, then would consider starting treatment at that time.   Ritta Slot, MD [facility] [phone]  If 7pm- 7am, please page neurology on call as listed in AMION.

## 2021-07-18 NOTE — TOC Initial Note (Signed)
Transition of Care [facility]) - Initial/Assessment Note    Patient Details  Name: Kevin Gilbert MRN: [id number] Date of Birth: 03-Apr-1981  [facility] CM/SW Contact:    Gildardo Griffes, LCSW Phone Number: 07/18/2021, 11:43 AM  Clinical Narrative:                  CSW spoke with patient regarding substance abuse resources in which he declines at this time.   Patient reports he does not have a PCP, however does state he has [employer] which is not reflected in his chart. CSW encouraged him to review providers in network in his area via [facility] website or phone number, as resources we provide here are only for those without insurance to go through [employer]. Patient expressed understanding.   CSW has emailed registration to update his chart reflecting insurance.   Expected Discharge Plan: Home/Self Care Barriers to Discharge: No Barriers Identified   Patient Goals and CMS Choice Patient states their goals for this hospitalization and ongoing recovery are:: to go home CMS Medicare.gov Compare Post Acute Care list provided to:: Patient Choice offered to / list presented to : Patient  Expected Discharge Plan and Services Expected Discharge Plan: Home/Self Care       Living arrangements for the past 2 months: Single Family Home Expected Discharge Date: 07/18/21                                    Prior Living Arrangements/Services Living arrangements for the past 2 months: Single Family Home Lives with:: Self                   Activities of Daily Living Home Assistive Devices/Equipment: None ADL Screening (condition at time of admission) Patient's cognitive ability adequate to safely complete daily activities?: Yes Is the patient deaf or have difficulty hearing?: No Does the patient have difficulty seeing, even when wearing glasses/contacts?: Yes Does the patient have difficulty concentrating, remembering, or making decisions?: No Patient able  to express need for assistance with ADLs?: No Does the patient have difficulty dressing or bathing?: No Independently performs ADLs?: Yes (appropriate for developmental age) Does the patient have difficulty walking or climbing stairs?: No Weakness of Legs: None Weakness of Arms/Hands: None  Permission Sought/Granted                  Emotional Assessment       Orientation: : Oriented to Self, Oriented to Place, Oriented to  Time, Oriented to Situation Alcohol / Substance Use: Alcohol Use Psych Involvement: No (comment)  Admission diagnosis:  Alcohol withdrawal seizure (HCC) [X52.841, R56.9] Alcohol withdrawal seizure without complication (HCC) [F10.230, R56.9] Patient Active Problem List   Diagnosis Date Noted   Alcohol withdrawal seizure (HCC) 07/17/2021   Seizure (HCC)    PCP:  Patient, No Pcp Per (Inactive) Pharmacy:  No Pharmacies Listed    Social Determinants of Health (SDOH) Interventions    Readmission Risk Interventions No flowsheet data found.

## 2021-07-18 NOTE — Discharge Summary (Signed)
Physician Discharge Summary  Kevin Gilbert FYB:[id number] DOB: 09-Jul-1981 DOA: 07/17/2021  PCP: Patient, No Pcp Per (Inactive)  Admit date: 07/17/2021 Discharge date: 07/18/2021  Admitted From: Home  disposition: Home  Recommendations for Outpatient Follow-up:  Follow up with PCP in 1-2 weeks Follow-up with neurology Please obtain BMP/CBC in one week Please follow up on the following pending results: None  Home Health: No Equipment/Devices: None Discharge Condition: Stable CODE STATUS: Full Diet recommendation: Heart Healthy    Brief/Interim Summary: Kevin Gilbert is a 40 y.o. Caucasian male with medical history significant for alcohol abuse, who presented to the emergency room with acute onset of tonic-clonic seizures.  The patient was found by his father his room with tonic-clonic seizure and bleeding from his mouth.  He admitted to biting his tongue.   No prior history of seizures.  Had family history of seizures in mother and aunts.  Patient drinks daily with no recent change in the drinking habit.  Denies any withdrawals before.  He did drink on the day of admission although alcohol level were negative.  UDS negative.  CT head and MRI brain was without any acute abnormality.  Magnesium within normal limit.  No significant lab abnormalities. EEG was without any abnormality.  Neurology was also consulted and they are considering it as first episode of unprovoked seizure.  Patient does not need any antiseizure medications at this time but if he experience another seizure that can be started.  He can follow-up with neurology as an outpatient for further recommendations.  He should not be driving for 78-month or until released by his neurologist.  He was also given thiamine and counseled for alcohol use.  Discharge Diagnoses:  Active Problems:   Alcohol withdrawal seizure (HCC)   Seizure [facility])   Discharge Instructions  Discharge Instructions     Diet - low sodium heart  healthy   Complete by: As directed    Discharge instructions   Complete by: As directed    It was pleasure taking care of you. It is very important that you do not drive for next 56-month and follow-up closely with a neurologist for further recommendations. Try to quit alcohol-you can always take help from your PCP or a psychiatrist.  We will not advise stopping abruptly as it can cause more trouble including seizures. Keep yourself well-hydrated.   Increase activity slowly   Complete by: As directed       Allergies as of 07/18/2021   No Known Allergies      Medication List     TAKE these medications    thiamine 100 MG tablet Take 1 tablet (100 mg total) by mouth daily. Start taking on: July 19, 2021        Follow-up Information     Lonell Face, MD. Schedule an appointment as soon as possible for a visit in 1 week(s).   Specialty: Neurology Contact information: [address] [facility] [place] [address] [phone]                No Known Allergies  Consultations: Neurology  Procedures/Studies: CT Head Wo Contrast  Result Date: 07/17/2021 CLINICAL DATA:  Altered mental status. EXAM: CT HEAD WITHOUT CONTRAST TECHNIQUE: Contiguous axial images were obtained from the base of the skull through the vertex without intravenous contrast. COMPARISON:  None. FINDINGS: Brain: The ventricles and sulci appropriate size for patient's age. The gray-white matter discrimination is preserved. There is no acute intracranial hemorrhage. No mass  effect or midline shift. No extra-axial fluid collection. Vascular: No hyperdense vessel or unexpected calcification. Skull: Normal. Negative for fracture or focal lesion. Sinuses/Orbits: The visualized paranasal sinuses and mastoid air cells are clear. Chronic changes of the left globe. Other: None IMPRESSION: Unremarkable noncontrast CT of the brain. Electronically Signed   By: Elgie Collard M.D.    On: 07/17/2021 02:20   MR BRAIN W WO CONTRAST  Result Date: 07/17/2021 CLINICAL DATA:  Seizure, nontraumatic EXAM: MRI HEAD WITHOUT AND WITH CONTRAST TECHNIQUE: Multiplanar, multiecho pulse sequences of the brain and surrounding structures were obtained without and with intravenous contrast. CONTRAST:  10mL GADAVIST GADOBUTROL 1 MMOL/ML IV SOLN COMPARISON:  None. FINDINGS: Motion artifact is present. Brain: There is no acute infarction or intracranial hemorrhage. There is no intracranial mass, mass effect, or edema. There is no hydrocephalus or extra-axial fluid collection. Ventricles and sulci are normal in size and configuration. Minimal punctate foci of T2 hyperintensity in the supratentorial white matter likely reflect nonspecific gliosis/demyelination of doubtful significance. Hippocampi demonstrate normal size and signal. No abnormal enhancement. Vascular: Major vessel flow voids at the skull base are preserved. Skull and upper cervical spine: Normal marrow signal is preserved. Sinuses/Orbits: Paranasal sinuses are aerated. Evidence of left retinal detachment therapy. Other: Sella is unremarkable.  Mastoid air cells are clear. IMPRESSION: No evidence of recent infarction, hemorrhage, or mass. No abnormal enhancement. Electronically Signed   By: Guadlupe Spanish M.D.   On: 07/17/2021 12:09   DG Chest Port 1 View  Result Date: 07/17/2021 CLINICAL DATA:  Snoring and sweating with blood coming from his mouth. EXAM: PORTABLE CHEST 1 VIEW COMPARISON:  December 04, 2004 FINDINGS: The heart size and mediastinal contours are within normal limits. Decreased lung volumes are seen. Both lungs are clear. No acute osseous abnormalities are identified. IMPRESSION: No active cardiopulmonary disease. Electronically Signed   By: Aram Candela M.D.   On: 07/17/2021 01:15   EEG adult  Result Date: 07/17/2021 Rejeana Brock, MD     07/17/2021  7:52 PM History: 40 yo M being evaluated for seizure Sedation: Noen  Technique: This EEG was acquired with electrodes placed according to the International 10-20 electrode system (including Fp1, Fp2, F3, F4, C3, C4, P3, P4, O1, O2, T3, T4, T5, T6, A1, A2, Fz, Cz, Pz). The following electrodes were missing or displaced: none. Background: The background consists of intermixed alpha and beta activities. There is a well defined posterior dominant rhythm of 9 Hz that attenuates with eye opening. With drowsiness, he has some frontally predominant intermittent rhythmic delta activity(FIRDA). Sleep is recorded with normal appearing structures. Photic stimulation: Physiologic driving is present EEG Abnormalities: 1) FIRDA Clinical Interpretation: This normal EEG is recorded in the waking and sleep state. Though FIRDA can be associated with encephalopahty, it can also be seen in normal drowsiness. There was no seizure or seizure predisposition recorded on this study. Please note that lack of epileptiform activity on EEG does not preclude the possibility of epilepsy. Ritta Slot, MD [facility] [phone] If 7pm- 7am, please page neurology on call as listed in AMION.    Subjective: Patient was seen and examined today.  No new complaints.  Parents at bedside.  But discussed about slowly quitting and taking medical assistance if needed.  We also discussed seizure precautions as he should not drive or operate machinery which can cause more harm.  Discharge Exam: Vitals:   07/18/21 0848 07/18/21 0856  BP: (!) 153/112 (!) 147/100  Pulse: 92 (!)  110  Resp: 18   Temp: 98.5 F (36.9 C)   SpO2: 96%    Vitals:   07/18/21 0448 07/18/21 0603 07/18/21 0848 07/18/21 0856  BP: (!) 123/97  (!) 153/112 (!) 147/100  Pulse: 91 95 92 (!) 110  Resp: 18  18   Temp: 98.2 F (36.8 C)  98.5 F (36.9 C)   TempSrc:   Oral   SpO2: 93%  96%   Weight:      Height:        General: Pt is alert, awake, not in acute distress Cardiovascular: RRR, S1/S2 +, no rubs, no  gallops Respiratory: CTA bilaterally, no wheezing, no rhonchi Abdominal: Soft, NT, ND, bowel sounds + Extremities: no edema, no cyanosis   The results of significant diagnostics from this hospitalization (including imaging, microbiology, ancillary and laboratory) are listed below for reference.    Microbiology: Recent Results (from the past 240 hour(s))  Resp Panel by RT-PCR (Flu A&B, Covid) Nasopharyngeal Swab     Status: None   Collection Time: 07/17/21 12:50 AM   Specimen: Nasopharyngeal Swab; Nasopharyngeal(NP) swabs in vial transport medium  Result Value Ref Range Status   SARS Coronavirus 2 by RT PCR NEGATIVE NEGATIVE Final    Comment: (NOTE) SARS-CoV-2 target nucleic acids are NOT DETECTED.  The SARS-CoV-2 RNA is generally detectable in upper respiratory specimens during the acute phase of infection. The lowest concentration of SARS-CoV-2 viral copies this assay can detect is 138 copies/mL. A negative result does not preclude SARS-Cov-2 infection and should not be used as the sole basis for treatment or other patient management decisions. A negative result may occur with  improper specimen collection/handling, submission of specimen other than nasopharyngeal swab, presence of viral mutation(s) within the areas targeted by this assay, and inadequate number of viral copies(<138 copies/mL). A negative result must be combined with clinical observations, patient history, and epidemiological information. The expected result is Negative.  Fact Sheet for Patients:  BloggerCourse.com  Fact Sheet for Healthcare Providers:  SeriousBroker.it  This test is no t yet approved or cleared by the [place] FDA and  has been authorized for detection and/or diagnosis of SARS-CoV-2 by FDA under an Emergency Use Authorization (EUA). This EUA will remain  in effect (meaning this test can be used) for the duration of the COVID-19  declaration under Section 564(b)(1) of the Act, 21 U.S.C.section 360bbb-3(b)(1), unless the authorization is terminated  or revoked sooner.       Influenza A by PCR NEGATIVE NEGATIVE Final   Influenza B by PCR NEGATIVE NEGATIVE Final    Comment: (NOTE) The Xpert Xpress SARS-CoV-2/FLU/RSV plus assay is intended as an aid in the diagnosis of influenza from Nasopharyngeal swab specimens and should not be used as a sole basis for treatment. Nasal washings and aspirates are unacceptable for Xpert Xpress SARS-CoV-2/FLU/RSV testing.  Fact Sheet for Patients: BloggerCourse.com  Fact Sheet for Healthcare Providers: SeriousBroker.it  This test is not yet approved or cleared by the [place] FDA and has been authorized for detection and/or diagnosis of SARS-CoV-2 by FDA under an Emergency Use Authorization (EUA). This EUA will remain in effect (meaning this test can be used) for the duration of the COVID-19 declaration under Section 564(b)(1) of the Act, 21 U.S.C. section 360bbb-3(b)(1), unless the authorization is terminated or revoked.  Performed at [facility], [address] [place], [place] [address]      Labs: BNP (last 3 results) No results for input(s): BNP in the last  8760 hours. Basic Metabolic Panel: Recent Labs  Lab 07/17/21 0040 07/17/21 0451  NA 135 135  K 3.6 3.8  CL 96* 102  CO2 15* 23  GLUCOSE 102* 80  BUN 17 16  CREATININE 0.88 0.77  CALCIUM 9.4 8.6*  MG  --  2.3   Liver Function Tests: Recent Labs  Lab 07/17/21 0040  AST 53*  ALT 33  ALKPHOS 55  BILITOT 1.3*  PROT 7.9  ALBUMIN 4.8   Recent Labs  Lab 07/17/21 0040  LIPASE 30   No results for input(s): AMMONIA in the last 168 hours. CBC: Recent Labs  Lab 07/17/21 0040 07/17/21 0451  WBC 6.7 8.4  HGB 16.1 14.8  HCT 46.8 41.5  MCV 94.2 94.3  PLT 269 214   Cardiac Enzymes: No results for input(s): CKTOTAL, CKMB,  CKMBINDEX, TROPONINI in the last 168 hours. BNP: Invalid input(s): POCBNP CBG: No results for input(s): GLUCAP in the last 168 hours. D-Dimer No results for input(s): DDIMER in the last 72 hours. Hgb A1c No results for input(s): HGBA1C in the last 72 hours. Lipid Profile No results for input(s): CHOL, HDL, LDLCALC, TRIG, CHOLHDL, LDLDIRECT in the last 72 hours. Thyroid function studies No results for input(s): TSH, T4TOTAL, T3FREE, THYROIDAB in the last 72 hours.  Invalid input(s): FREET3 Anemia work up No results for input(s): VITAMINB12, FOLATE, FERRITIN, TIBC, IRON, RETICCTPCT in the last 72 hours. Urinalysis No results found for: COLORURINE, APPEARANCEUR, LABSPEC, PHURINE, GLUCOSEU, HGBUR, BILIRUBINUR, KETONESUR, PROTEINUR, UROBILINOGEN, NITRITE, LEUKOCYTESUR Sepsis Labs Invalid input(s): PROCALCITONIN,  WBC,  LACTICIDVEN Microbiology Recent Results (from the past 240 hour(s))  Resp Panel by RT-PCR (Flu A&B, Covid) Nasopharyngeal Swab     Status: None   Collection Time: 07/17/21 12:50 AM   Specimen: Nasopharyngeal Swab; Nasopharyngeal(NP) swabs in vial transport medium  Result Value Ref Range Status   SARS Coronavirus 2 by RT PCR NEGATIVE NEGATIVE Final    Comment: (NOTE) SARS-CoV-2 target nucleic acids are NOT DETECTED.  The SARS-CoV-2 RNA is generally detectable in upper respiratory specimens during the acute phase of infection. The lowest concentration of SARS-CoV-2 viral copies this assay can detect is 138 copies/mL. A negative result does not preclude SARS-Cov-2 infection and should not be used as the sole basis for treatment or other patient management decisions. A negative result may occur with  improper specimen collection/handling, submission of specimen other than nasopharyngeal swab, presence of viral mutation(s) within the areas targeted by this assay, and inadequate number of viral copies(<138 copies/mL). A negative result must be combined with clinical  observations, patient history, and epidemiological information. The expected result is Negative.  Fact Sheet for Patients:  BloggerCourse.comhttps://www.fda.gov/media/152166/download  Fact Sheet for Healthcare Providers:  SeriousBroker.ithttps://www.fda.gov/media/152162/download  This test is no t yet approved or cleared by the [place] FDA and  has been authorized for detection and/or diagnosis of SARS-CoV-2 by FDA under an Emergency Use Authorization (EUA). This EUA will remain  in effect (meaning this test can be used) for the duration of the COVID-19 declaration under Section 564(b)(1) of the Act, 21 U.S.C.section 360bbb-3(b)(1), unless the authorization is terminated  or revoked sooner.       Influenza A by PCR NEGATIVE NEGATIVE Final   Influenza B by PCR NEGATIVE NEGATIVE Final    Comment: (NOTE) The Xpert Xpress SARS-CoV-2/FLU/RSV plus assay is intended as an aid in the diagnosis of influenza from Nasopharyngeal swab specimens and should not be used as a sole basis for treatment. Nasal washings and aspirates are  unacceptable for Xpert Xpress SARS-CoV-2/FLU/RSV testing.  Fact Sheet for Patients: BloggerCourse.com  Fact Sheet for Healthcare Providers: SeriousBroker.it  This test is not yet approved or cleared by the [place] FDA and has been authorized for detection and/or diagnosis of SARS-CoV-2 by FDA under an Emergency Use Authorization (EUA). This EUA will remain in effect (meaning this test can be used) for the duration of the COVID-19 declaration under Section 564(b)(1) of the Act, 21 U.S.C. section 360bbb-3(b)(1), unless the authorization is terminated or revoked.  Performed at [facility], [address] [place], [place] [address]     Time coordinating discharge: Over 30 minutes  SIGNED:  Arnetha Courser, MD  [facility] 07/18/2021, 11:12 AM  If 7PM-7AM, please contact night-coverage [email or web address]  This  record has been created using Conservation officer, historic buildings. Errors have been sought and corrected,but may not always be located. Such creation errors do not reflect on the standard of care.

## 2021-07-18 NOTE — Progress Notes (Signed)
Discharge instructions, RX's, work note and follow up appts explained and provided to patient verbalized understanding. Patient left floor via wheelchair accompanied by staff no c/o pain or shortness of breath at discharge.  Danyell Shader, Kae Heller, RN

## 2021-07-18 NOTE — Plan of Care (Signed)
  Problem: Education: Goal: Knowledge of General Education information will improve Description Including pain rating scale, medication(s)/side effects and non-pharmacologic comfort measures Outcome: Progressing   Problem: Health Behavior/Discharge Planning: Goal: Ability to manage health-related needs will improve Outcome: Progressing   

## 2021-08-15 NOTE — Progress Notes (Signed)
 Today the history is gathered from: 100% - patient  0% - alone today  RECORDS SUMMARY: I have reviewed the note dated 07/17/2021 from Vermell Grooms who has indicated:  Patient reported to ER for tonic- clonic seizures. Patient admitted to bitting his tonge. EEG, Ct, and MRI brain were all wnl.  Given these abnormal neurologic findings, a referral to neurology has been recommended.  REFERRING PHYSICIAN: [facility]* PRIMARY CARE PHYSICIAN:  Provider   IMPRESSION/PLAN  Kevin Gilbert is a 40 y.o. male presenting for evaluation of   SEIZURE LIKE ACTIVITY / ALCOHOL ABUSE/ TONGUE BITING/ CONFUSION - New to me. - Patient with seizure like activity that occurred on August 29th.  - Reviewed routine EEG, MRI of brain and CT all were within normal limits. - Extended 3 hour monitoring EEG. - Call our office if seizure like activity increases. - If seizure like activity increases will consider starting epileptic medication. - Reviewed [place] law of no driving for 6 months after seizure like event.  Medications previously tried:  Follow-up with Dr. Lane in 6 months  p=4  CHIEF COMPLAINT & HPI  Kevin Gilbert is a 40 y.o. male presenting for evaluation of: Chief Complaint  Patient presents with  . SEIZURE LIKE ACTIVITY / ALCOHOL ABUSE/ TONGUE BITING/ CONFU    SEIZURE LIKE ACTIVITY / ALCOHOL ABUSE/ TONGUE BITING/ CONFUSION  Patient states on August 29, he woke up not feeling well. Tried to go to work and vomited in the driveway. He went back to his room. His parents found him. He was told he was making weird noizes, and looked like he was trying to swallow his tongue. He did bite his tongue during event. He did have some confusion after. No urine or fecal incontinence during event. Parents took him to the ER. He reported he had consumed about 6 beers earlier in the day.  Denies recreational drug use. This was his first life time event. He reported his mother has seizures. He is drinking  about 6 beers per day on a routine basis. Not on any prescriptions other than Vitamin B. He had a routine EEG, MRI and CT while at hospital. Feels like his memory is a little worse since his seizure.   DATA SUMMARY:   EEG Adult 07/17/2021 Clinical Interpretation: This normal EEG is recorded in the  waking and sleep state. Though FIRDA can be associated with  encephalopahty, it can also be seen in normal drowsiness.  There was no seizure or seizure predisposition recorded on this  study. Please note that lack of epileptiform activity on EEG does  not preclude the possibility of epilepsy.    MRI BRAIN WO 07/17/2021 IMPRESSION:  No evidence of recent infarction, hemorrhage, or mass. No abnormal  enhancement.   CT HEAD WO 07/17/2021 IMPRESSION:  Unremarkable noncontrast CT of the brain.     VISIT SUMMARIES: 08/15/2021:  Patient with seizure like activity that occurred on August 29th.  Reviewed routine EEG, MRI of brain and CT all were within normal limits.Extended 3 hour monitoring EEG. Call our office if seizure like activity increases. If seizure like activity increases will consider starting epileptic medication. Reviewed [place] law of no driving for 6 months after seizure like event.   MEDICATIONS Outpatient Medications Marked as Taking for the 08/15/21 encounter (Initial consult) with Lane Arthea Locus, MD  Medication Sig  . thiamine  (VITAMIN B-1) 100 MG tablet Take 100 mg by mouth once daily    ALLERGIES No Known Allergies   EXAM  Vitals:   08/15/21 0902  BP: 128/88  Weight: (!) 106.1 kg (234 lb)  Height: 182.9 cm (6')  PainSc: 0-No pain   Body mass index is 31.74 kg/m.  GENERAL: Pleasant male, NAD Normocephalic and atraumatic.   PAST MEDICAL HISTORY History reviewed. No pertinent past medical history.  PAST SURGICAL HISTORY History reviewed. No pertinent surgical history.  FAMILY HISTORY History reviewed. No pertinent family history.  SOCIAL HISTORY   Social History   Tobacco Use  . Smoking status: Former Games developer  . Smokeless tobacco: Never Used  Vaping Use  . Vaping Use: Every day     REVIEW OF SYSTEMS:  13 system ROS form was given to the patient to complete and I have reviewed it.  The form was sent for scan to the patient's EHR.  Pertinent positives and negatives are mentioned above in the HPI and all other systems are negative.   DATA  I have personally reviewed all of the data outlined below both prior to the appointment and during the appointment with the patient as appropriate.  No visits with results within 6 Month(s) from this visit.  Latest known visit with results is:  No results found for any previous visit.      No follow-ups on file.  Payor: UHC / Plan: UNITED HEALTHCARE CHOICE PLUS / Product Type: POS /      This note is partially written by Duwaine Pesa in the presence of and acting as the scribe of Dr. Arthea Farrow.   I have reviewed, edited and added to the note as needed to reflect my best personal medical judgment.    Dr. Arthea Farrow, MD [facility] [place], [place] Ph:  [phone] Fax:  [phone]

## 2021-08-28 NOTE — Procedures (Signed)
 [facility], Department of [facility] Department of Neurology, Neurophysiology Laboratory [address]  [place], [place] [address]  Office:  [phone] / Fax:  [phone]  EXTENDED IN-OFFICE CONTINUOUSLY MONITORED VIDEO EEG  Name:  Kevin Gilbert                           Date of Birth:  1981-04-11    Referring Physician:  Self Account No:  [id number]    Recording Date: 08/28/21 Leads On Tech:  Madeline   Duration:  2 hours 45 minutes   Leads Off Tech:  Madeline Monitoring Tech: Charmaine Blitz On Time:  9:20 AM Leads Off Time:  11:59 AM   The patient's identity was confirmed with two factors.  The EEG order was verified. The procedure, equipment, and anticipated test duration were reviewed with the patient.  Time was allowed for any questions and the patient agreed to proceed with the test.  Twenty-five electrodes were placed and the study was initiated.  CLINICAL HISTORY AND INDICATION/S:  40 y.o. male is referred for EEG due to seizure-like episode where patient made weird noises and bit his tongue.  Epilepsy medications: none  Current Outpatient Medications:  .  bimatoprost (LUMIGAN) 0.03 % ophthalmic solution, INSTILL 1 DROP IN LEFT EYE NIGHTLY (Patient not taking: Reported on 08/15/2021), Disp: , Rfl:  .  brimonidine (ALPHAGAN) 0.2 % ophthalmic solution, INSTILL 1 DROP INTO LEFT EYE TWICE A DAY (Patient not taking: Reported on 08/15/2021), Disp: , Rfl:  .  dorzolamide-timoloL (COSOPT) 22.3-6.8 mg/mL ophthalmic solution, INSTILL 1 DROP INTO LEFT EYE TWICE A DAY (Patient not taking: Reported on 08/15/2021), Disp: , Rfl:  .  HYDROcodone -acetaminophen  (NORCO) 5-325 mg tablet, Take one tablet at night for pain; may take up to every 6 hours as needed for pain if not working or driving (Patient not taking: Reported on 08/15/2021), Disp: 20 tablet, Rfl: 0 .  HYDROcodone -acetaminophen  (NORCO) 5-325 mg tablet, Take 1 tablet by mouth every 6 (six) hours as needed for up to 12  doses (Patient not taking: Reported on 08/15/2021), Disp: 12 tablet, Rfl: 0 .  ibuprofen  (MOTRIN ) 200 MG tablet, Take by mouth every 6 (six) hours as needed (Patient not taking: Reported on 08/15/2021), Disp: , Rfl:  .  prednisoLONE acetate (PRED FORTE) 1 % ophthalmic suspension, Place 1 drop into the left eye 3 (three) times a day. (Patient not taking: Reported on 08/15/2021), Disp: , Rfl:  .  thiamine  (VITAMIN B-1) 100 MG tablet, Take 100 mg by mouth once daily, Disp: , Rfl:   REASON:  Evaluate for ictal versus interictal epileptiform activity.  Characterize possible seizure disorder.  TECHNICAL: Electroencephalogram of the above-mentioned patient was acquired with [employer] equipment.  Electrodes were placed according to the internationally standardized 10-20 system, after individualized distance measurement.  The study was recorded digitally with a bandpass of 1-70 Hz and a sampling rate of at least 200 Hz and was reviewed with the possibility of multiple reformatting.  An EKG channel is utilized.  The patient is monitored on video synchronized to the EEG during this recording and is continuously monitored for the duration of this study.  The patient was continuously monitored by the technologist for the duration of this study.  DESCRIPTION: The posterior dominant rhythm is a 10 Hz activity that attenuates with eye opening.  The background is comprised primarily of alpha and beta range frequencies.    The patient enters into  stage II sleep as seen by the appearance of sleep spindles.    There are no abnormal activities captured on video.  There are no patient pushbuttons indicating occurrence of a typical event or abnormal behaviors observed by the technologist.  There are no definite ictal or interictal epileptiform activities identified.  IMPRESSION: This extended in-office continuously monitored video EEG in the awake and asleep states is within normal limits.  I have reviewed, edited and  added to the note as needed to reflect my best personal medical judgment.    Dr. Arthea Farrow, MD [facility] [place], [place] Ph:  [phone] Fax:  [phone]   CONTINUOUS MONITORING NOTES: START: 9:20AM 9:20 AM: Patient awake, eyes closed, head center, lying back in chair. 9:30 AM: Patient awake, eyes closed, head center, lying back in chair. 9:40 AM:  Patient laying in chair.  Eyes closed. Head straight. 9:50 AM: Patient drowsy, eyes closed, head center, lying back in chair. Moving hands occasionally.  10:00 AM:  Patient laying in chair.  Eyes closed.  Head tilted. 10:10 AM: Patient resting quietly, eyes closed, head center, lying back in chair.  10:20 AM: Patient awake, eyes closed, head center, lying back in chair. 10:30 AM: Patient drowsy, head center, lying back in chair. 10:40 AM: Patient awake.  Eyes closed.  Laying in chair.  Head straight.  Moving foot. 10:50 AM:  Patient laying in chair.  Eyes closed.  Head centered. 11:00 AM: Patient laying in chair.  Eyes closed.  Head straight. 11:10 AM:  Patient laying in chair.  Leg propped up.  Head centered eyes closed. 11:20 AM: Patient laying in chair.  Eyes closed.  Hands crossed. 11:30 AM: Patient asleep, head center, eyes closed, lying back in chair.  11:40 AM: Patient laying in chair.  Asleep.  Head straight. END: 11:50AM

## 2021-10-16 IMAGING — CT CT HEAD W/O CM
3 series · 15 of 46 positions shown, 18 images · non-contrast
Comparison: None.

CLINICAL DATA: Altered mental status.

EXAM:
CT HEAD WITHOUT CONTRAST
TECHNIQUE: Contiguous axial images were obtained from the base of the skull
through the vertex without intravenous contrast.

[Series 2: head wo · axial · 0.42mm/px · z∈[-35,+85]mm · 9 of 29 slices shown, 12 images]
[im 3/29  brain]
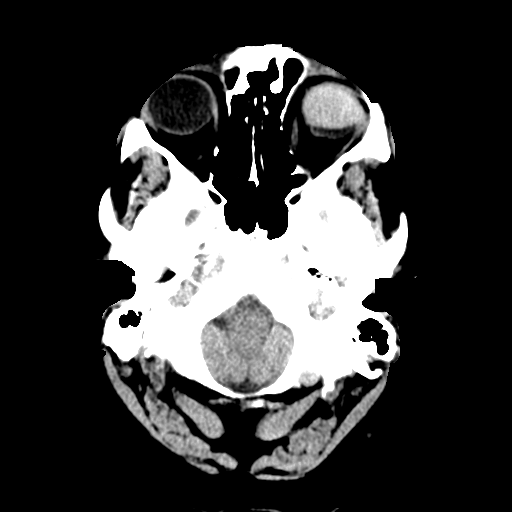
[im 3/29  bone]
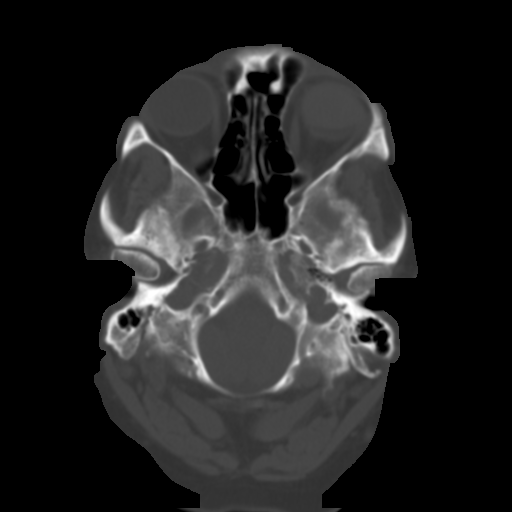
[im 6/29  brain]
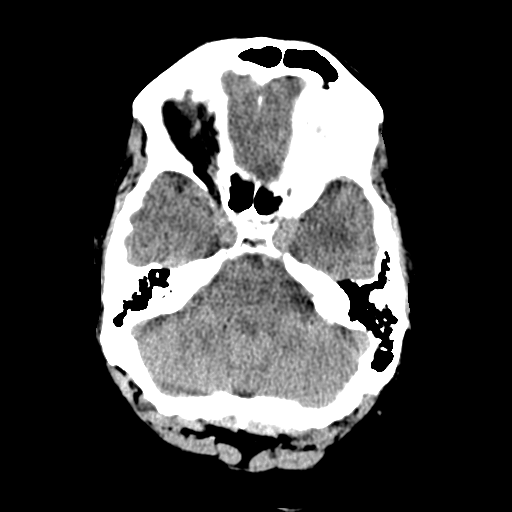
[im 9/29  brain]
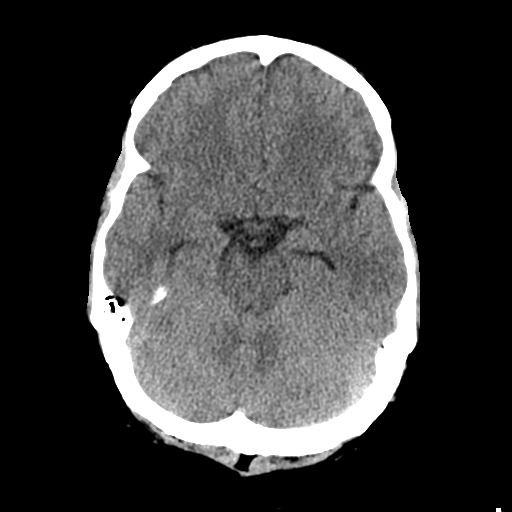
[im 12/29  brain]
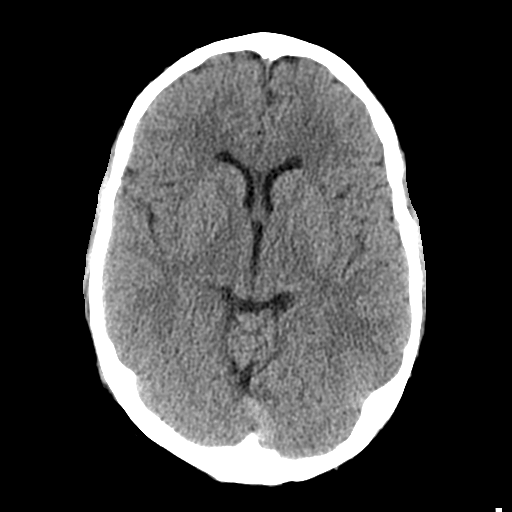
[im 15/29  brain]
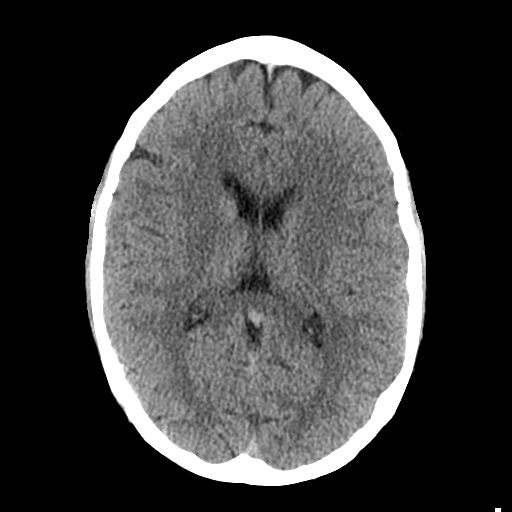
[im 15/29  bone]
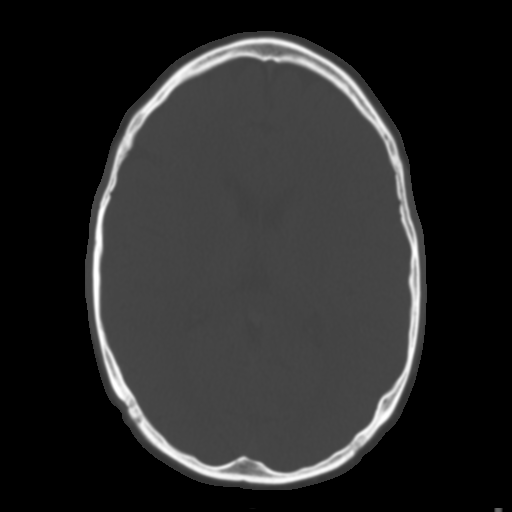
[im 18/29  brain]
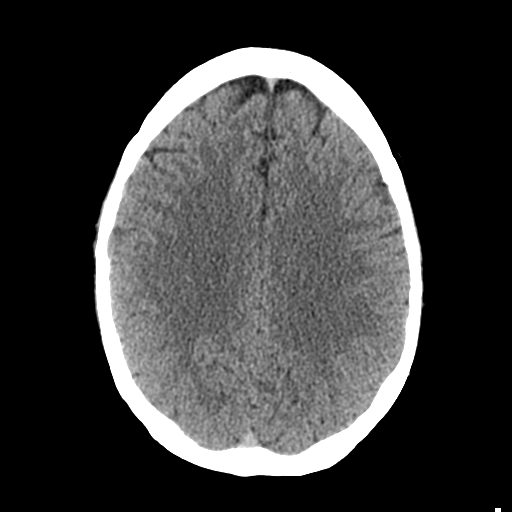
[im 21/29  brain]
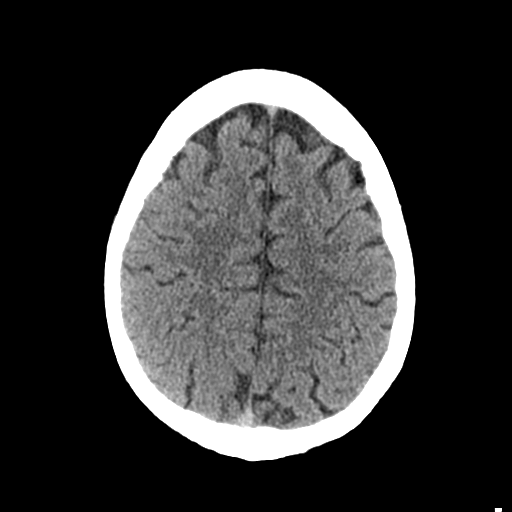
[im 24/29  brain]
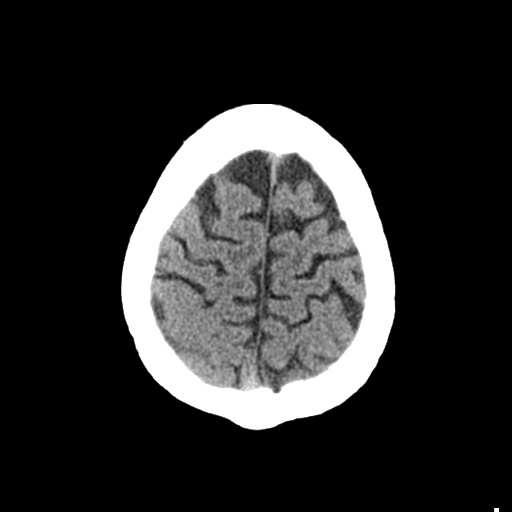
[im 27/29  brain]
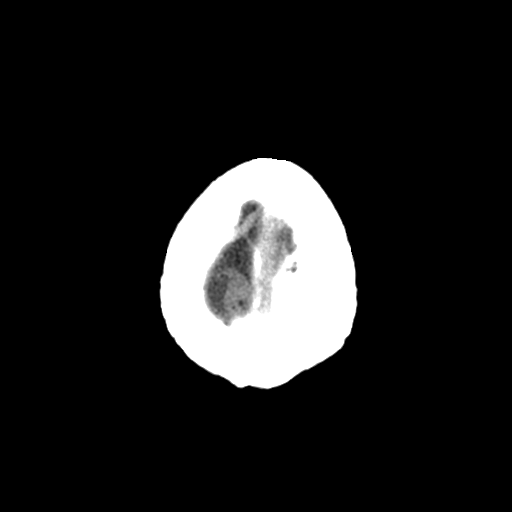
[im 27/29  bone]
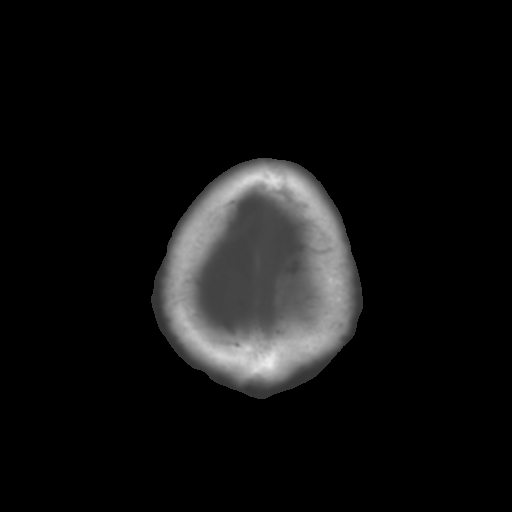

[Series 4: coronal soft tissue · coronal · 0.29mm/px · 3 of 70 slices shown]
[im 24/70  brain]
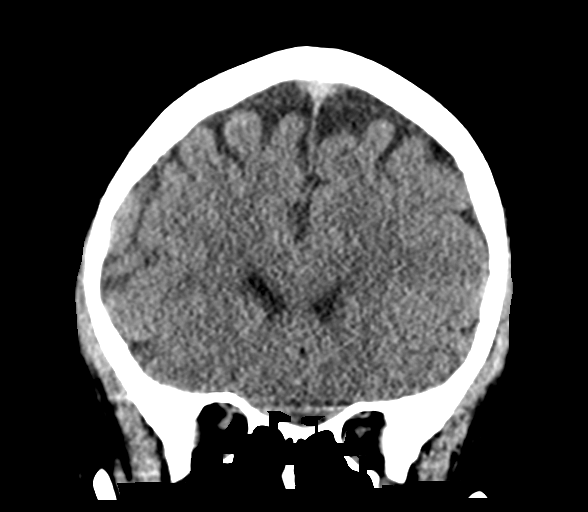
[im 31/70  brain]
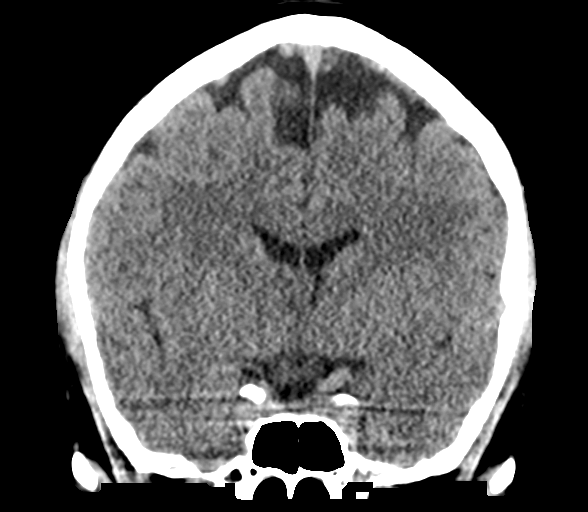
[im 39/70  brain]
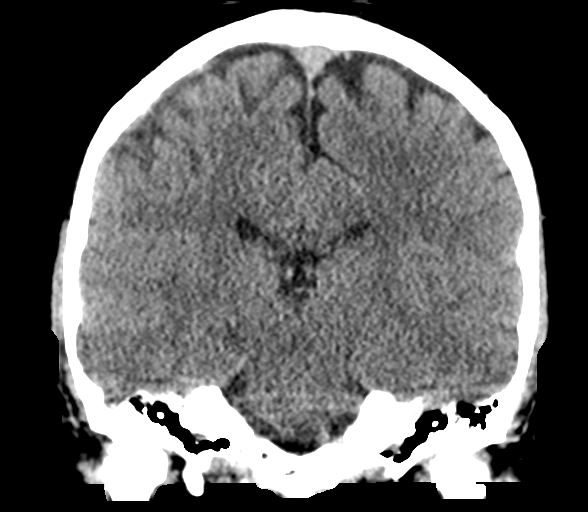

[Series 5: sagittal soft tissue · sagittal · 0.29mm/px · 3 of 55 slices shown]
[im 19/55  brain]
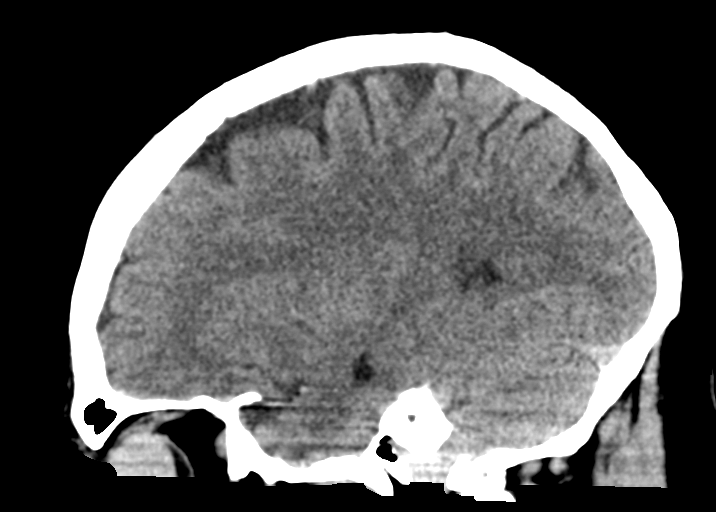
[im 28/55  brain]
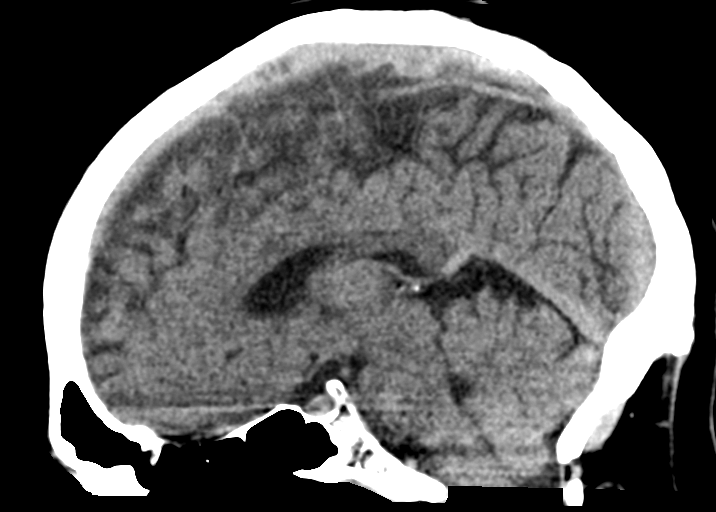
[im 37/55  brain]
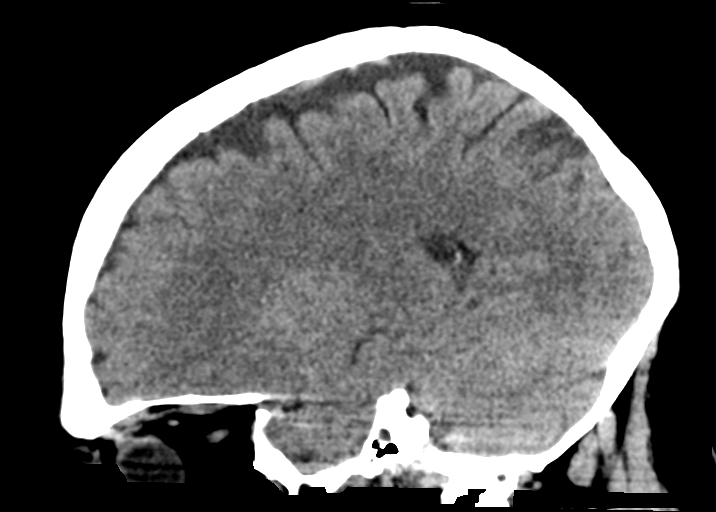

[15 of 46 positions shown; findings below may reference images not displayed]

FINDINGS: Brain: The ventricles and sulci appropriate size for patient's age.
The gray-white matter discrimination is preserved. There is no acute
intracranial hemorrhage. No mass effect or midline shift. No
extra-axial fluid collection.

Vascular: No hyperdense vessel or unexpected calcification.

Skull: Normal. Negative for fracture or focal lesion.

Sinuses/Orbits: The visualized paranasal sinuses and mastoid air
cells are clear. Chronic changes of the left globe.

Other: None
IMPRESSION: Unremarkable noncontrast CT of the brain.

## 2023-03-16 ENCOUNTER — Emergency Department
Admission: EM | Admit: 2023-03-16 | Discharge: 2023-03-16 | Discharge status: Against Medical Advice | Payer: 59 | Source: Home / Self Care

## 2024-07-13 ENCOUNTER — Emergency Department: Payer: Self-pay

## 2024-07-13 ENCOUNTER — Other Ambulatory Visit: Payer: Self-pay

## 2024-07-13 ENCOUNTER — Emergency Department
Admission: EM | Admit: 2024-07-13 | Discharge: 2024-07-13 | Disposition: A | Discharge status: Home / Self Care | Payer: Self-pay | Attending: Emergency Medicine | Admitting: Emergency Medicine

## 2024-07-13 DIAGNOSIS — R258 Other abnormal involuntary movements: Secondary | ICD-10-CM | POA: Insufficient documentation

## 2024-07-13 DIAGNOSIS — X58XXXA Exposure to other specified factors, initial encounter: Secondary | ICD-10-CM | POA: Insufficient documentation

## 2024-07-13 DIAGNOSIS — S0990XA Unspecified injury of head, initial encounter: Secondary | ICD-10-CM

## 2024-07-13 DIAGNOSIS — Y990 Civilian activity done for income or pay: Secondary | ICD-10-CM | POA: Insufficient documentation

## 2024-07-13 DIAGNOSIS — E876 Hypokalemia: Secondary | ICD-10-CM | POA: Insufficient documentation

## 2024-07-13 DIAGNOSIS — S0083XA Contusion of other part of head, initial encounter: Secondary | ICD-10-CM | POA: Insufficient documentation

## 2024-07-13 DIAGNOSIS — R569 Unspecified convulsions: Secondary | ICD-10-CM

## 2024-07-13 LAB — COMPREHENSIVE METABOLIC PANEL WITH GFR
ALT: 34 U/L (ref 0–44)
AST: 61 U/L — ABNORMAL HIGH (ref 15–41)
Albumin: 4.7 g/dL (ref 3.5–5.0)
Alkaline Phosphatase: 85 U/L (ref 38–126)
Anion gap: 19 — ABNORMAL HIGH (ref 5–15)
BUN: 21 mg/dL — ABNORMAL HIGH (ref 6–20)
CO2: 18 mmol/L — ABNORMAL LOW (ref 22–32)
Calcium: 9.9 mg/dL (ref 8.9–10.3)
Chloride: 101 mmol/L (ref 98–111)
Creatinine, Ser: 1.08 mg/dL (ref 0.61–1.24)
GFR, Estimated: >60 mL/min (ref >60)
Glucose, Bld: 180 mg/dL — ABNORMAL HIGH (ref 70–99)
Potassium: 3 mmol/L — ABNORMAL LOW (ref 3.5–5.1)
Sodium: 138 mmol/L (ref 135–145)
Total Bilirubin: 1.2 mg/dL (ref 0.0–1.2)
Total Protein: 8.1 g/dL (ref 6.5–8.1)

## 2024-07-13 LAB — CBC WITH DIFFERENTIAL/PLATELET
Abs Immature Granulocytes: 0.03 K/uL (ref 0.00–0.07)
Basophils Absolute: 0.1 K/uL (ref 0.0–0.1)
Basophils Relative: 1 %
Eosinophils Absolute: 0 K/uL (ref 0.0–0.5)
Eosinophils Relative: 0 %
HCT: 46.3 % (ref 39.0–52.0)
Hemoglobin: 15.8 g/dL (ref 13.0–17.0)
Immature Granulocytes: 0 %
Lymphocytes Relative: 30 %
Lymphs Abs: 2 K/uL (ref 0.7–4.0)
MCH: 32.4 pg (ref 26.0–34.0)
MCHC: 34.1 g/dL (ref 30.0–36.0)
MCV: 94.9 fL (ref 80.0–100.0)
Monocytes Absolute: 0.5 K/uL (ref 0.1–1.0)
Monocytes Relative: 7 %
Neutro Abs: 4.1 K/uL (ref 1.7–7.7)
Neutrophils Relative %: 62 %
Platelets: 202 K/uL (ref 150–400)
RBC: 4.88 MIL/uL (ref 4.22–5.81)
RDW: 13.5 % (ref 11.5–15.5)
WBC: 6.7 K/uL (ref 4.0–10.5)
nRBC: 0 % (ref 0.0–0.2)

## 2024-07-13 LAB — TROPONIN I (HIGH SENSITIVITY): Troponin I (High Sensitivity): 6 ng/L (ref <18)

## 2024-07-13 MED ORDER — FENTANYL CITRATE PF 50 MCG/ML IJ SOSY: 50.0000 ug | PREFILLED_SYRINGE | Freq: Once | INTRAMUSCULAR | Status: DC

## 2024-07-13 MED ORDER — LEVETIRACETAM 500 MG PO TABS
500.0000 mg | ORAL_TABLET | Freq: Once | ORAL | Status: AC
  Administered 2024-07-13: 500 mg via ORAL
  Filled 2024-07-13: qty 1

## 2024-07-13 MED ORDER — IOHEXOL 350 MG/ML SOLN
75.0000 mL | Freq: Once | INTRAVENOUS | Status: AC | PRN
  Administered 2024-07-13: 75 mL via INTRAVENOUS

## 2024-07-13 MED ORDER — LIDOCAINE VISCOUS HCL 2 % MT SOLN
15.0000 mL | Freq: Once | OROMUCOSAL | Status: AC
  Administered 2024-07-13: 15 mL via OROMUCOSAL
  Filled 2024-07-13: qty 15

## 2024-07-13 MED ORDER — HYDROCODONE-ACETAMINOPHEN 5-325 MG PO TABS
1.0000 | ORAL_TABLET | Freq: Once | ORAL | Status: AC
  Administered 2024-07-13: 1 via ORAL
  Filled 2024-07-13: qty 1

## 2024-07-13 MED ORDER — SODIUM CHLORIDE 0.9 % IV BOLUS
1000.0000 mL | Freq: Once | INTRAVENOUS | Status: AC
  Administered 2024-07-13: 1000 mL via INTRAVENOUS

## 2024-07-13 MED ORDER — POTASSIUM CHLORIDE CRYS ER 20 MEQ PO TBCR: 40.0000 meq | EXTENDED_RELEASE_TABLET | Freq: Once | ORAL | Status: DC

## 2024-07-13 MED ORDER — POTASSIUM CHLORIDE 20 MEQ PO PACK
40.0000 meq | PACK | Freq: Two times a day (BID) | ORAL | Status: DC
  Administered 2024-07-13: 40 meq via ORAL
  Filled 2024-07-13: qty 2

## 2024-07-13 MED ORDER — LEVETIRACETAM 500 MG PO TABS: 500.0000 mg | ORAL_TABLET | Freq: Two times a day (BID) | ORAL | 2 refills | Status: AC

## 2024-07-13 NOTE — ED Notes (Signed)
 Fall precautions in place for Pt. This RN placed fall band, fall grip socks, bed alarm and fall sign.    Labs sent w/save tube labels.

## 2024-07-13 NOTE — ED Triage Notes (Addendum)
 Pt to ED from Work. [employer], after being found down and unconscious in the break room by coworkers. PT has a hematoma over the left eye and bit his tongue. Pt denies any pain.  Pt is blind in left eye  PT was extremely confused for EMS, Is less confused for us . Is able to give us  more information than he was for EMS.   Vitals for EMS: BP: 120/87 O2: 96% on room air CBG:143 HR:165

## 2024-07-13 NOTE — Discharge Instructions (Addendum)
 You were seen in the ER today after being found unresponsive.  I suspect you likely had a seizure that was unwitnessed.  As this is the second time you have had a possible seizure, I do think we should start you on medication.  I sent a prescription for Keppra  to your pharmacy.  Please take this as directed.  Please follow-up with neurology as soon as possible for further evaluation. Please do not drive, swim or bathe unsupervised, or climb to heights for six months or until otherwise instructed by your doctor. Return to the ER for recurrent seizures or for other new or concerning symptoms.

## 2024-07-13 NOTE — ED Notes (Signed)
 Pt placed on 2L [place] due to  O2 levels 88 on room air.

## 2024-07-13 NOTE — ED Notes (Signed)
 Pt to CT

## 2024-07-13 NOTE — ED Notes (Signed)
 Pt called dad to come and pick him up. PT give discharge paperwork and instructions. Pt verbalized understanding.

## 2024-07-13 NOTE — ED Notes (Signed)
 CCMD called for cardiac monitoring.

## 2024-07-13 NOTE — ED Provider Notes (Signed)
 [facility] Provider Note    Event Date/Time   First MD Initiated Contact with Patient 07/13/24 [phone]     (approximate)   History   Fall   HPI  Kevin Gilbert is a 43 year old male presenting to the emergency department for evaluation of altered mental status.  Patient was found unresponsive in the break room at [employer] by his coworkers.  He was noted to have a hematoma over his forehead.  Did seem to have improving mental status on the way.  Here, patient unable to tell me what happened initially but was able to tell me that he works at [employer].  He denies chest pain or shortness of breath.  Reports feeling at his baseline over the past few days.  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  I reviewed his discharge summary from 07/18/21.  At that time patient presented with altered mental status after witnessed seizure, admitted given concerns for alcohol withdrawal seizure.  However after obtaining additional history it did not appear that patient had recently changed his alcohol intake and it was thought to likely be a first-time unprovoked seizure, not started on AEDs.  Did follow-up with neurology in September 2022.  Noted to have normal routine EEG, MRI brain at that time.        Physical Exam   Triage Vital Signs: ED Triage Vitals  Encounter Vitals Group     BP 07/13/24 0340 (!) 163/94     Girls Systolic BP Percentile --      Girls Diastolic BP Percentile --      Boys Systolic BP Percentile --      Boys Diastolic BP Percentile --      Pulse Rate 07/13/24 0335 (!) 133     Resp 07/13/24 0335 20     Temp 07/13/24 0335 98.1 F (36.7 C)     Temp src --      SpO2 07/13/24 0335 90 %     Weight 07/13/24 0343 265 lb (120.2 kg)     Height 07/13/24 0343 5' 11 (1.803 m)     Head Circumference  --      Peak Flow --      Pain Score 07/13/24 0338 0     Pain Loc --      Pain Education --      Exclude from Growth Chart --     Most recent vital signs: Vitals:   07/13/24 0600 07/13/24 0630  BP: (!) 164/103 (!) 147/94  Pulse: (!) 108 (!) 112  Resp: (!) 23 20  Temp:    SpO2: 95% 99%     General: Awake, confused HEENT: Large hematoma over the left forehead without open areas skin.  But there is dried blood within the nares without visible hematoma.  No blood within the mouth, but it does appear that patient bit the right side of his tongue without associated laceration CV:  Tachycardic with regular rhythm Chest wall:      Nontender to palpation  Resp:  Unlabored respirations, lungs clear to auscultation, sats in the upper 80s on room air Abd:  Nondistended, soft, nontender Neuro:  Symmetric facial movement, moving all extremities spontaneously and equally with 5 out of 5 strength and intact sensation, normal finger-nose testing   ED Results / Procedures / Treatments  Labs (all labs ordered are listed, but only abnormal results are displayed) Labs Reviewed  COMPREHENSIVE METABOLIC PANEL WITH GFR - Abnormal; Notable for the following components:      Result Value   Potassium 3.0 (*)    CO2 18 (*)    Glucose, Bld 180 (*)    BUN 21 (*)    AST 61 (*)    Anion gap 19 (*)    All other components within normal limits  CBC WITH DIFFERENTIAL/PLATELET  TROPONIN I (HIGH SENSITIVITY)     EKG EKG independently reviewed and interpreted by myself demonstrates:  EKG demonstrates sinus tachycardia at a rate of 133, PR 65, QRS 106, QTc 460, no acute ST changes  RADIOLOGY Imaging independently reviewed and interpreted by myself demonstrates:  CT head without acute bleed Chest x-Tamirah George without acute abnormality CTA of the chest without evidence of PE  Formal Radiology Read:  CT Angio Chest PE W and/or Wo Contrast Result Date: 07/13/2024 CLINICAL DATA:  Presents to the ED from  work where he was found down in the break room. Fell and hit head but is lucid at the moment. EXAM: CT ANGIOGRAPHY CHEST WITH CONTRAST TECHNIQUE: Multidetector CT imaging of the chest was performed using the standard protocol during bolus administration of intravenous contrast. Multiplanar CT image reconstructions and MIPs were obtained to evaluate the vascular anatomy. RADIATION DOSE REDUCTION: This exam was performed according to the departmental dose-optimization program which includes automated exposure control, adjustment of the mA and/or kV according to patient size and/or use of iterative reconstruction technique. CONTRAST:  75mL OMNIPAQUE  IOHEXOL  350 MG/ML SOLN COMPARISON:  Portable chest today, portable chest 07/17/2021, PA Lat chest 12/04/2004, CTA chest 10/31/2004. FINDINGS: Cardiovascular: There is mild cardiomegaly with left chamber predominance. There is trace scattered single-vessel calcific plaque in the LAD coronary artery. There are no aortic calcifications. Small calcific plaque noted at the right subclavian artery origin, otherwise no great vessel calcification. The aorta and great vessels are tortuous but normal in caliber. No aneurysm, dissection or stenosis is seen. This can be seen with untreated or poorly treated hypertension. The pulmonary trunk is prominent at 3.2 cm. There is diagnostic arterial opacification. No pulmonary arterial embolus is seen. The pulmonary veins are normal. No pericardial effusion. Mediastinum/Nodes: No enlarged mediastinal, hilar, or axillary lymph nodes. Thyroid gland, trachea, and esophagus demonstrate no significant findings. There is a small hiatal hernia. Lungs/Pleura: Central airways are clear. There is mild central bronchial thickening, without bronchiectasis or plugging. Posterior atelectasis both lower lobes. No consolidation, effusion or pneumothorax. No visible nodules. Upper Abdomen: Moderate hepatic steatosis. No acute abnormality. Negatively small  calcified thrombus along the dorsal wall of the IVC. Musculoskeletal: There are chronic fused wedge compression fractures of T6 and 7, which were previously acute. Secondary foraminal stenosis noted T6-7 and T7-8 and interval healing of the posterior element fractures from T5-7 noted previously. There are bulky anterior bridging osteophytes from T7 to T9. There is a healed upper body of sternum fracture. No acute or other significant osseous abnormality. There are healed fractures of some of the left ribs. No mass in the visualized chest wall. Review of the MIP images confirms the above findings. IMPRESSION: 1. No acute chest CT or CTA findings. 2. Cardiomegaly with left chamber predominance. 3. Prominent pulmonary trunk 3.2 cm, which may be seen with pulmonary arterial hypertension. No arterial embolus is seen. 4. Aortic and branch vessel tortuosity without aneurysm, dissection or stenosis. This can be seen with untreated or poorly  treated hypertension. 5. Bronchial the wall thickening without bronchiectasis or plugging. 6. Small hiatal hernia. 7. Moderate hepatic steatosis. 8. Chronic fused wedge compression fractures of T6 and 7, with interval healing of the posterior element fractures from T5-7 noted previously. Electronically Signed   By: Francis Quam M.D.   On: 07/13/2024 06:23   CT Head Wo Contrast Result Date: 07/13/2024 CLINICAL DATA:  43 year old man found down. Left scalp hematoma, bit tongue. EXAM: CT HEAD WITHOUT CONTRAST TECHNIQUE: Contiguous axial images were obtained from the base of the skull through the vertex without intravenous contrast. RADIATION DOSE REDUCTION: This exam was performed according to the departmental dose-optimization program which includes automated exposure control, adjustment of the mA and/or kV according to patient size and/or use of iterative reconstruction technique. COMPARISON:  Brain MRI and head CT 07/17/2021. FINDINGS: Brain: Cerebral volume is stable and within  normal limits. No midline shift, ventriculomegaly, mass effect, evidence of mass lesion, intracranial hemorrhage or evidence of cortically based acute infarction. Gray-white matter differentiation is within normal limits throughout the brain. Small left-side coronal fissure cyst redemonstrated (normal variant). Vascular: No suspicious intracranial vascular hyperdensity. Skull: Chronic left zygomaticomaxillary and orbital wall fractures, with maxillary ORIF. Stable visualized osseous structures. No acute osseous abnormality identified. Congenital incomplete ossification of the posterior C1 ring, normal variant. Sinuses/Orbits: Mild left frontal and ethmoid sinus mucosal thickening. No layering sinus fluid or hemorrhage. Tympanic cavities and mastoids are clear. Other: Left periorbital and supraorbital scalp hematoma measuring up to 15 mm in thickness. Chronic postoperative changes to the left globe, intraorbital soft tissues appear stable. No scalp soft tissue gas. IMPRESSION: 1. Left periorbital and forehead scalp hematoma without underlying acute skull fracture. 2. Stable and normal noncontrast CT appearance of the brain. Electronically Signed   By: VEAR Hurst M.D.   On: 07/13/2024 04:20   DG Chest Portable 1 View Result Date: 07/13/2024 CLINICAL DATA:  43 year old male found down.  Hypoxia. EXAM: PORTABLE CHEST 1 VIEW COMPARISON:  Portable chest 07/17/2021 and earlier. FINDINGS: Portable AP semi upright view at 0356 hours. Low normal lung volumes. Normal cardiac size and mediastinal contours. Visualized tracheal air column is within normal limits. Allowing for portable technique the lungs are clear. No pneumothorax or pleural effusion. Negative visible bowel gas. No acute osseous abnormality identified. IMPRESSION: No acute cardiopulmonary abnormality or acute traumatic injury identified. Electronically Signed   By: VEAR Hurst M.D.   On: 07/13/2024 04:11    PROCEDURES:  Critical Care performed:  No  Procedures   MEDICATIONS ORDERED IN ED: Medications  potassium chloride  (KLOR-CON ) packet 40 mEq (40 mEq Oral Given 07/13/24 0546)  levETIRAcetam  (KEPPRA ) tablet 500 mg (has no administration in time range)  sodium chloride  0.9 % bolus 1,000 mL (0 mLs Intravenous Stopped 07/13/24 0453)  iohexol  (OMNIPAQUE ) 350 MG/ML injection 75 mL (75 mLs Intravenous Contrast Given 07/13/24 0535)  lidocaine  (XYLOCAINE ) 2 % viscous mouth solution 15 mL (15 mLs Mouth/Throat Given 07/13/24 0556)  HYDROcodone -acetaminophen  (NORCO/VICODIN) 5-325 MG per tablet 1 tablet (1 tablet Oral Given 07/13/24 0556)     IMPRESSION / MDM / ASSESSMENT AND PLAN / ED COURSE  I reviewed the triage vital signs and the nursing notes.  Differential diagnosis includes, but is not limited to, intracranial bleed, skull fracture, no evidence of spine or thoracoabdominal trauma, electrolyte abnormality, primary seizure disorder, clinical history not suggestive of alcohol withdrawal seizure  Patient's presentation is most consistent with acute presentation with potential threat to life or bodily function.  43 year old male  presenting after being found unresponsive with head trauma, improving mental status on exam here.  Tachycardic and hypoxic, but denying cardiorespiratory symptoms.  CT head obtained demonstrating hematoma as noted on exam, but no intracranial bleed or skull fracture.  No indication for cervical spine imaging by Nexus criteria.  Chest x-Sharmaine Bain without obvious abnormality.  With his tachycardia and new onset hypoxia, will obtain CTA of the chest to further evaluate, though he is clinically without chest pain or shortness of breath.  Labs with reassuring CBC, CMP with mild hypokalemia for which she was ordered for oral repletion.  Mild anion gap metabolic acidosis  noted.  Negative troponin.  Patient with significant improving mental status on serial exams here.  I am concerned about likely seizure with his tongue biting, likely  postictal state, history of , similar episode in the past.  CTA resulted without evidence of PE, chronic findings of possible hypertension noted.  Patient with intermittent hypertension here.  He was able to be weaned off of oxygen to room air.  Suspect his initial hypoxia may have been related to shallow respirations in the postictal period.  His tachycardia improved after fluids.  He was reassessed and updated on results of workup.  He does feel that he is back at his neurologic baseline.  He is comfortable with discharge home.  He was given information for follow-up with neurology.  With this now being his second episode, do think it is reasonable to start him on Keppra  500 mg twice daily until he is able to arrange follow-up.  Seizure precautions provided.  Patient discharged in stable condition.   FINAL CLINICAL IMPRESSION(S) / ED DIAGNOSES   Final diagnoses:  Seizure-like activity (HCC)  Closed head injury, initial encounter  Hypokalemia     Rx / DC Orders   ED Discharge Orders          Ordered    levETIRAcetam  (KEPPRA ) 500 MG tablet  2 times daily        07/13/24 0650             Note:  This document was prepared using Dragon voice recognition software and may include unintentional dictation errors.   Levander Slate, MD 07/13/24 [phone]

## 2024-08-14 ENCOUNTER — Emergency Department
Admission: EM | Admit: 2024-08-14 | Discharge: 2024-08-14 | Disposition: A | Discharge status: Home / Self Care | Payer: Self-pay | Attending: Emergency Medicine | Admitting: Emergency Medicine

## 2024-08-14 ENCOUNTER — Other Ambulatory Visit: Payer: Self-pay

## 2024-08-14 ENCOUNTER — Encounter: Payer: Self-pay | Admitting: Emergency Medicine

## 2024-08-14 ENCOUNTER — Emergency Department: Payer: Self-pay

## 2024-08-14 DIAGNOSIS — M7989 Other specified soft tissue disorders: Secondary | ICD-10-CM | POA: Insufficient documentation

## 2024-08-14 DIAGNOSIS — R030 Elevated blood-pressure reading, without diagnosis of hypertension: Secondary | ICD-10-CM | POA: Insufficient documentation

## 2024-08-14 DIAGNOSIS — S8391XA Sprain of unspecified site of right knee, initial encounter: Secondary | ICD-10-CM | POA: Insufficient documentation

## 2024-08-14 DIAGNOSIS — X501XXA Overexertion from prolonged static or awkward postures, initial encounter: Secondary | ICD-10-CM | POA: Insufficient documentation

## 2024-08-14 DIAGNOSIS — S838X1A Sprain of other specified parts of right knee, initial encounter: Secondary | ICD-10-CM

## 2024-08-14 MED ORDER — HYDROCODONE-ACETAMINOPHEN 5-325 MG PO TABS: 1.0000 | ORAL_TABLET | Freq: Three times a day (TID) | ORAL | 0 refills | Status: AC | PRN

## 2024-08-14 MED ORDER — IBUPROFEN 800 MG PO TABS: 800.0000 mg | ORAL_TABLET | Freq: Three times a day (TID) | ORAL | 0 refills | Status: AC | PRN

## 2024-08-14 NOTE — Discharge Instructions (Addendum)
 Follow up with Dr. Lorelle if any continued problems with your knee.  Ice and elevation as needed for swelling and pain.  Wear the knee immobilizer for support and protection.  Begin taking the ibuprofen  800 mg every 8 hours with food.  The hydrocodone  as needed for moderate to severe pain.  Do not drive or operate machinery while taking this medication as it could cause drowsiness and increase your risk for injury.  Also while in the emergency department your blood pressure was elevated which may be due to your knee pain however you should have your blood pressure rechecked by your primary care provider or urgent care to see if it remains elevated at which time he may be placed on blood pressure medication.

## 2024-08-14 NOTE — ED Triage Notes (Signed)
 Patient to ED via POV for right knee pain. States he stepped on it weird a couple of days ago and heard a pop on the back. Ambulatory with limp.

## 2024-08-14 NOTE — ED Provider Notes (Signed)
 [facility] Provider Note    Event Date/Time   First MD Initiated Contact with Patient 08/14/24 0815     (approximate)   History   Knee Pain   HPI  Thai Burgueno Hammar is a 43 y.o. male presents to the ED with complaint of right knee pain.  Patient states that he stepped back a couple days ago and heard a pop to the back of his knee.  Since that time he has had pain but has continued to be able to ambulate.  He reports that he can weight-bear but is limping while walking.  He is unsure if he had a injury to his knee from football in the past.  Patient has been taking naproxen without any relief.  Patient reports that he used a chemical ice pack and a elastic sleeve brace on his knee last night.  Patient has a history of alcohol withdrawal seizures.     Physical Exam   Triage Vital Signs: ED Triage Vitals  Encounter Vitals Group     BP 08/14/24 0809 (!) 146/103     Girls Systolic BP Percentile --      Girls Diastolic BP Percentile --      Boys Systolic BP Percentile --      Boys Diastolic BP Percentile --      Pulse Rate 08/14/24 0809 92     Resp 08/14/24 0809 17     Temp 08/14/24 0809 97.8 F (36.6 C)     Temp Source 08/14/24 0809 Oral     SpO2 08/14/24 0809 92 %     Weight 08/14/24 0808 264 lb 8.8 oz (120 kg)     Height 08/14/24 0808 5' 11 (1.803 m)     Head Circumference --      Peak Flow --      Pain Score 08/14/24 0808 8     Pain Loc --      Pain Education --      Exclude from Growth Chart --     Most recent vital signs: Vitals:   08/14/24 0930 08/14/24 0932  BP: (!) 147/104 (!) 147/104  Pulse: 80   Resp: 16   Temp:    SpO2: 97% 97%     General: Awake, no distress.  Talkative, cooperative, appears to be uncomfortable.  Ambulates in the hallway. CV:  Good peripheral perfusion.  Resp:  Normal effort.  Abd:  No distention.  Other:  On examination of the right knee there is a discoloration in the shape of 2 squares and 1 circle.   Patient reports most likely this is from his knee brace that he wore last evening.  No gross deformity.  No effusion.  Tender generalized anterior and posterior.  Range of motion is restricted secondary to increased pain.  Patient is able to bear weight.   ED Results / Procedures / Treatments   Labs (all labs ordered are listed, but only abnormal results are displayed) Labs Reviewed - No data to display   RADIOLOGY Right knee x-ray images reviewed and interpreted by myself independent of the radiologist and was negative for fracture or dislocation.  Radiology report also mentions a minimal joint effusion.    PROCEDURES:  Critical Care performed:   Procedures   MEDICATIONS ORDERED IN ED: Medications - No data to display   IMPRESSION / MDM / ASSESSMENT AND PLAN / ED COURSE  I reviewed the triage vital signs and the nursing notes.   Differential diagnosis includes,  but is not limited to, right knee sprain, internal derangement, fracture, dislocation, degenerative joint disease.  43 year old male presents to the ED with complaint of right knee pain after he heard a pop to the back of his knee a couple days ago when he took a weird step.  Patient has occasionally taken naproxen with minimal relief.  X-rays were reassuring and patient was made aware that there was some small amount of fluid noted but that the bones were healthy.  A knee immobilizer was applied and patient was given a prescription for ibuprofen  and hydrocodone .  He is to follow-up with Dr.Aberman if he continues to have problems with his knee.  With the knee immobilizer patient was ambulatory at discharge.  It was also noted that his blood pressure was elevated.  He was made aware and also instructed to follow-up with his PCP for recheck of his blood pressure and treatment for hypertension if needed.      Patient's presentation is most consistent with acute complicated illness / injury requiring diagnostic  workup.  FINAL CLINICAL IMPRESSION(S) / ED DIAGNOSES   Final diagnoses:  Sprain of other ligament of right knee, initial encounter  Elevated blood pressure reading     Rx / DC Orders   ED Discharge Orders          Ordered    HYDROcodone -acetaminophen  (NORCO/VICODIN) 5-325 MG tablet  Every 8 hours PRN        08/14/24 0912    ibuprofen  (ADVIL ) 800 MG tablet  Every 8 hours PRN        08/14/24 0912             Note:  This document was prepared using Dragon voice recognition software and may include unintentional dictation errors.   Saunders Shona CROME, PA-C 08/14/24 1520    Dorothyann Drivers, MD 08/14/24 1536

## 2024-08-14 NOTE — ED Notes (Signed)
 See triage note  States he stepped wrong couple of days ago  Felt a pop was able to ambulate well   No limp  No swelling States pain is mainly when he turns his knee slightly
